# Patient Record
Sex: Male | Born: 1985 | Race: White | Hispanic: No | Marital: Single | Smoking: Never smoker
Health system: Southern US, Community
[De-identification: ages and names within clinical notes are randomized; demographics above are authoritative.]

## PROBLEM LIST (undated history)

## (undated) DIAGNOSIS — U071 COVID-19: Secondary | ICD-10-CM

## (undated) DIAGNOSIS — F419 Anxiety disorder, unspecified: Secondary | ICD-10-CM

## (undated) DIAGNOSIS — G43909 Migraine, unspecified, not intractable, without status migrainosus: Secondary | ICD-10-CM

## (undated) DIAGNOSIS — R61 Generalized hyperhidrosis: Secondary | ICD-10-CM

## (undated) DIAGNOSIS — K219 Gastro-esophageal reflux disease without esophagitis: Secondary | ICD-10-CM

## (undated) HISTORY — DX: Anxiety disorder, unspecified: F41.9

## (undated) HISTORY — DX: COVID-19: U07.1

## (undated) HISTORY — DX: Gastro-esophageal reflux disease without esophagitis: K21.9

## (undated) HISTORY — DX: Generalized hyperhidrosis: R61

## (undated) HISTORY — PX: TONSILLECTOMY: SUR1361

## (undated) HISTORY — DX: Migraine, unspecified, not intractable, without status migrainosus: G43.909

---

## 2004-05-05 ENCOUNTER — Ambulatory Visit: Payer: Self-pay | Admitting: Pediatrics

## 2004-05-13 ENCOUNTER — Ambulatory Visit: Payer: Self-pay | Admitting: Pediatrics

## 2004-05-27 ENCOUNTER — Ambulatory Visit: Payer: Self-pay | Admitting: Gastroenterology

## 2004-09-21 ENCOUNTER — Ambulatory Visit: Payer: Self-pay | Admitting: Gastroenterology

## 2004-12-20 ENCOUNTER — Ambulatory Visit: Payer: Self-pay | Admitting: Gastroenterology

## 2006-01-14 IMAGING — CT CT ABD-PELV W/ CM
1 of 2 series · 15 of 32 positions shown, 19 images · non-contrast
Comparison: none

REASON FOR EXAM: Abdominal pain, RIGHT lower quadrant pain
COMMENTS:

[Series 2: appendicitis · axial · 0.64mm/px · z∈[-537,-165]mm · 15 of 136 slices shown, 19 images]
[im 6/136  soft-tissue]
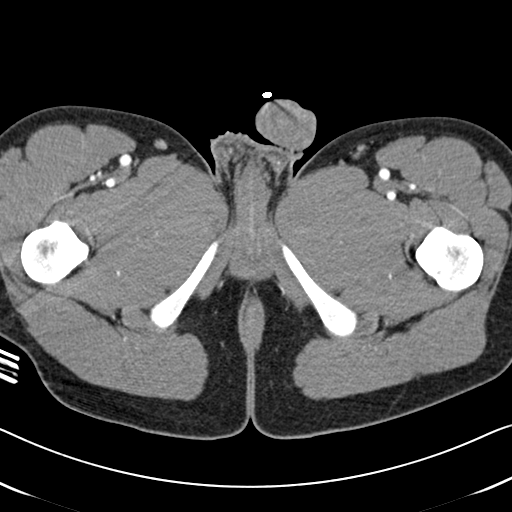
[im 6/136  bone]
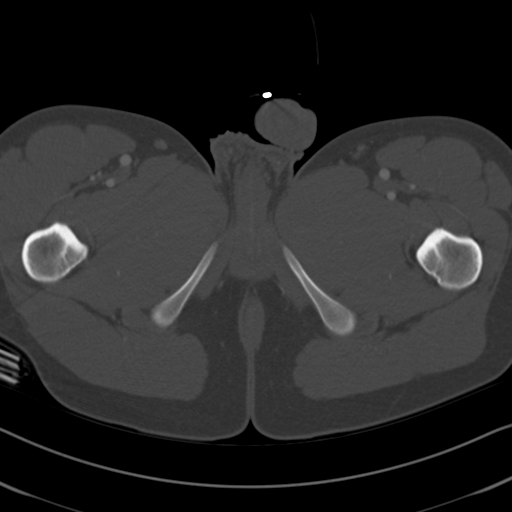
[im 16/136  soft-tissue]
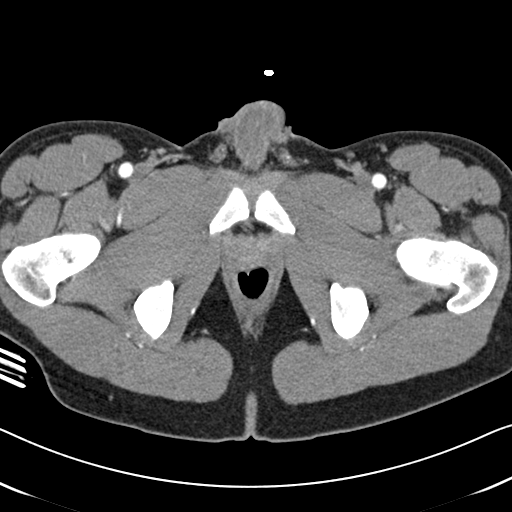
[im 26/136  soft-tissue]
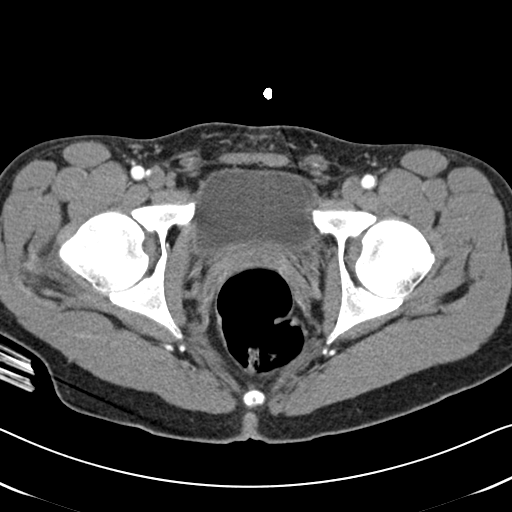
[im 37/136  soft-tissue]
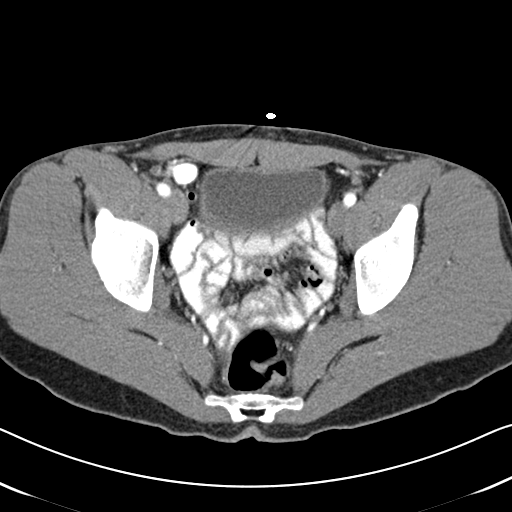
[im 47/136  soft-tissue]
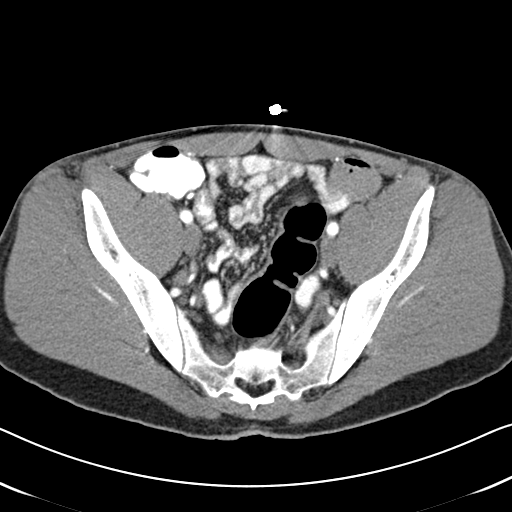
[im 58/136  soft-tissue]
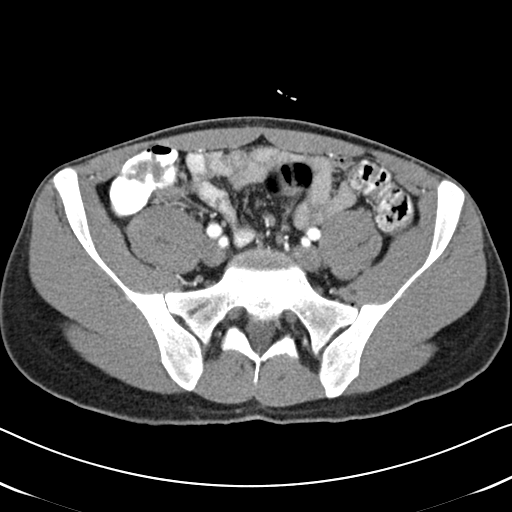
[im 68/136  soft-tissue]
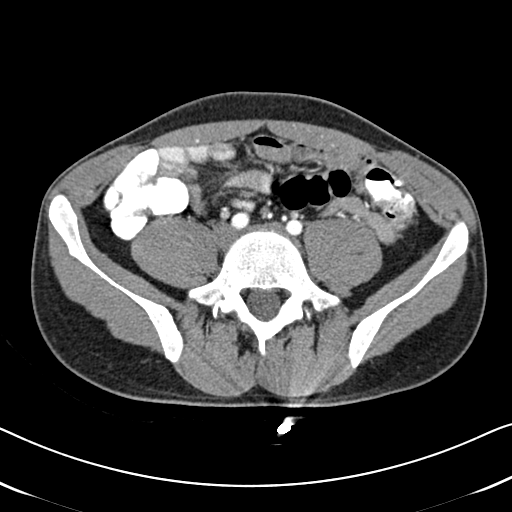
[im 78/136  soft-tissue]
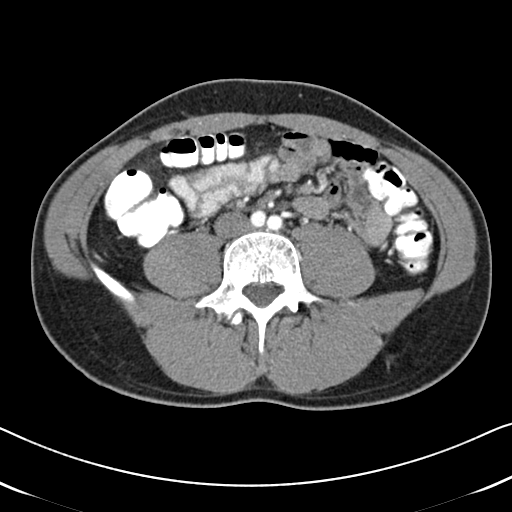
[im 89/136  soft-tissue]
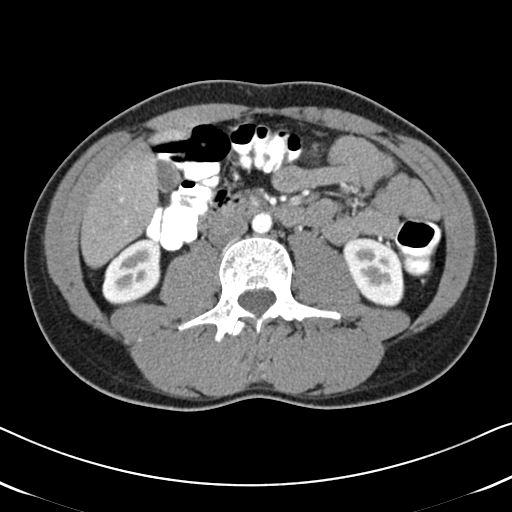
[im 89/136  bone]
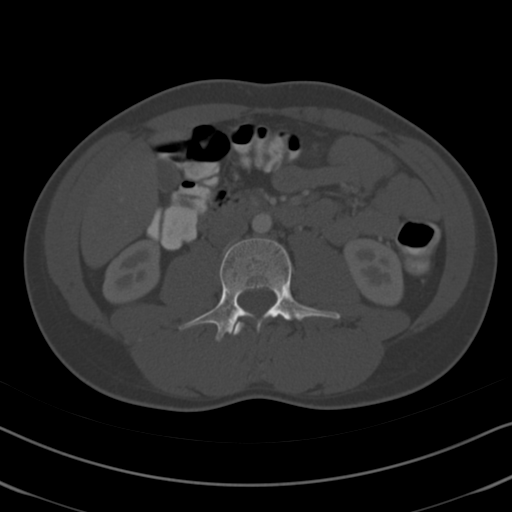
[im 99/136  soft-tissue]
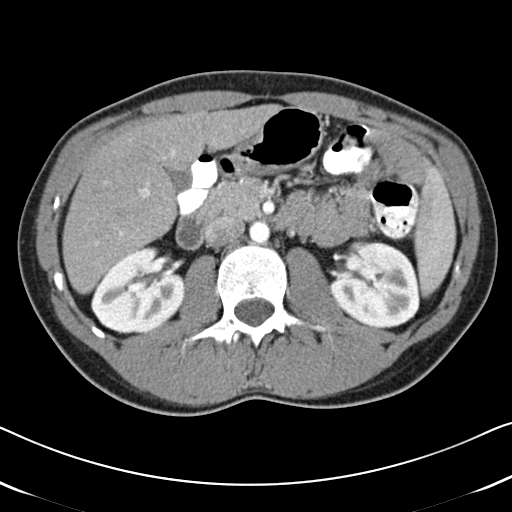
[im 110/136  soft-tissue]
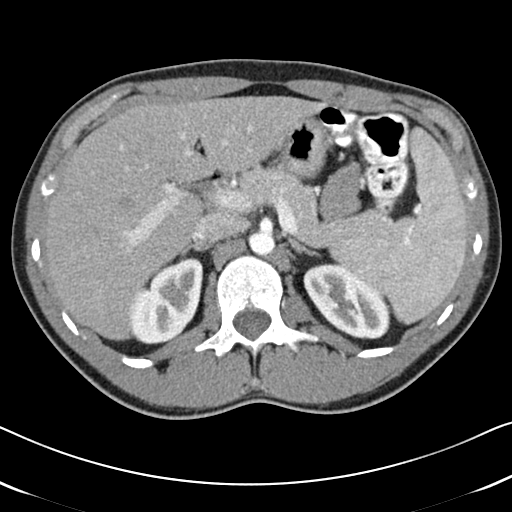
[im 115/136  lung]
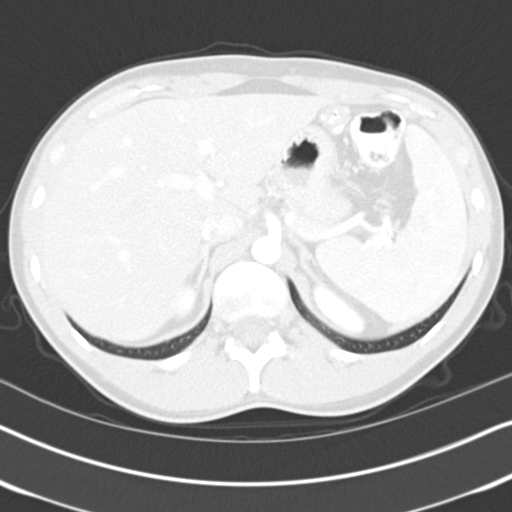
[im 120/136  soft-tissue]
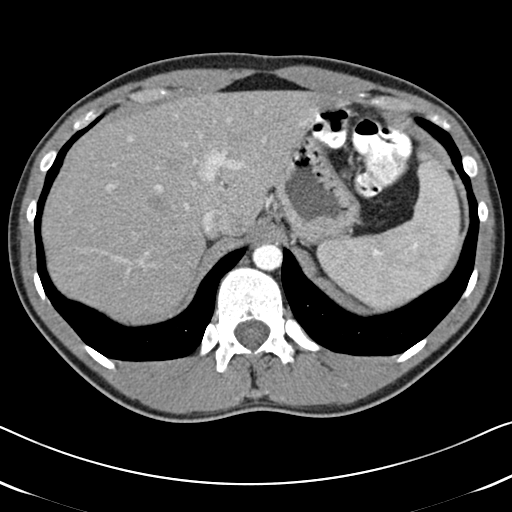
[im 120/136  lung]
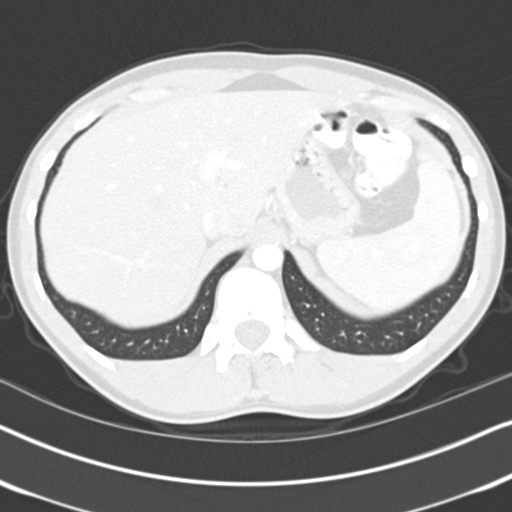
[im 125/136  lung]
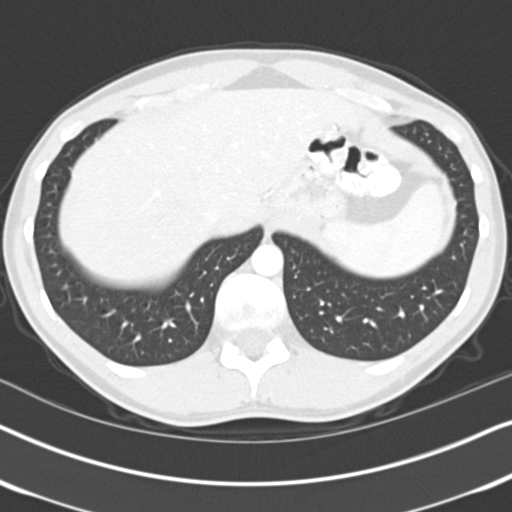
[im 130/136  soft-tissue]
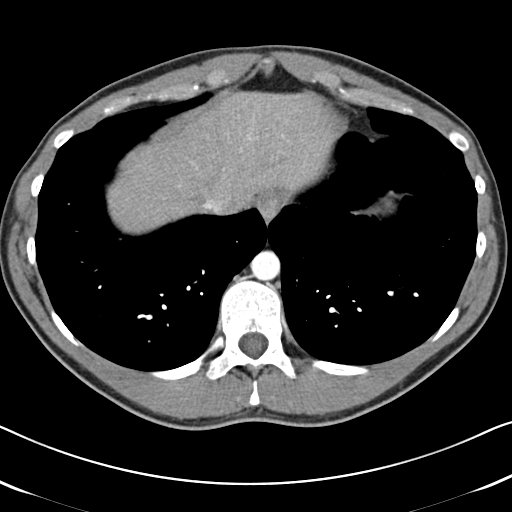
[im 130/136  lung]
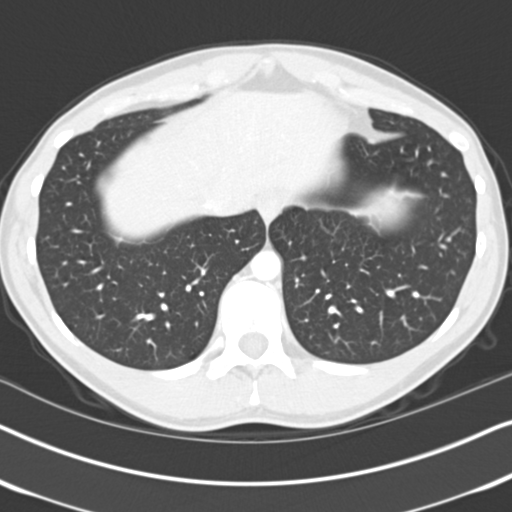

[15 of 32 positions shown; findings below may reference images not displayed]

PROCEDURE:     CT  - CT ABDOMEN / PELVIS  W  - May 05, 2004  [DATE]

RESULT:     3-mm helical cuts were performed through the abdomen and pelvis
with oral and 100 cc of Isovue-8AN contrast.

Lung bases are clear.  No pleural or pericardial effusions are present.  The
solid organs of the abdomen are unremarkable.  No free intraperitoneal
fluid, air, or adenopathy is noted.

In the RIGHT lower quadrant, the appendix fills normally with contrast.
There is no evidence of periappendiceal inflammatory changes or suspicious
fluid collection.  There is some subtle thickening of the terminal ileum but
I suspect this is simply incomplete distention with contrast.  The bladder
distends normally without evidence of filling defect or wall thickening.
There is no inguinal mass or adenopathy.
IMPRESSION: No CT evidence of appendicitis.  The appendix itself is visualized and fills
normally with oral contrast.  There is no periappendiceal inflammatory
changes or thickening.

No suspicious solid organ abnormalities, free intraperitoneal fluid, air, or
adenopathy.

Lung bases are clear.

## 2006-06-02 IMAGING — US ABDOMEN ULTRASOUND
1 series · 17 of 25 positions shown · non-contrast
Comparison: none

REASON FOR EXAM: RUQ pain, dyspepsia
                          If neg U/S, do HIDA with CCK
COMMENTS:

[Series 1: abdomen ultrasound · 17 of 40 slices shown]
[im 1/40]
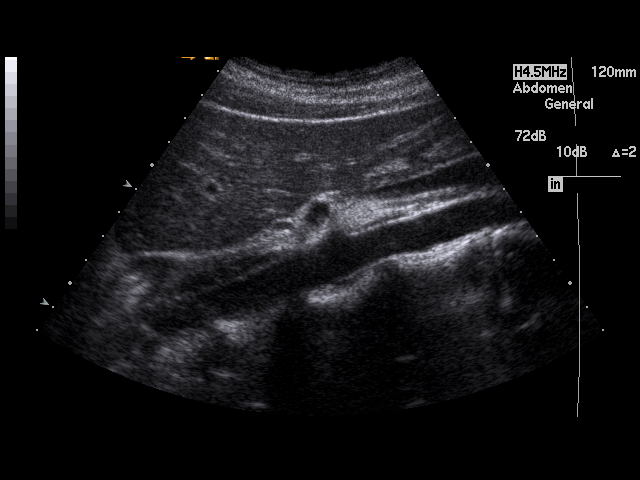
[im 4/40]
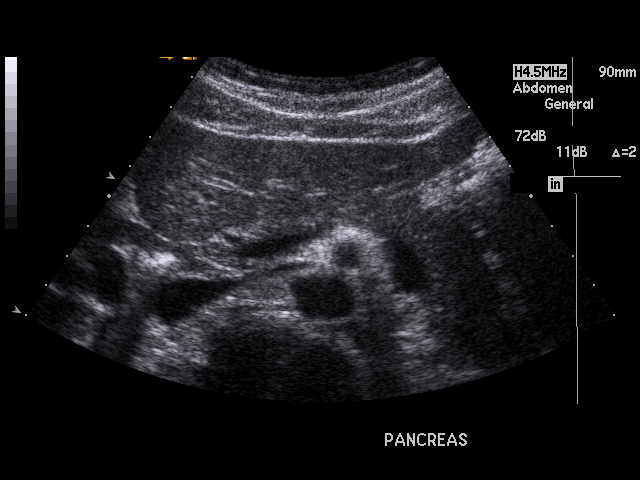
[im 5/40]
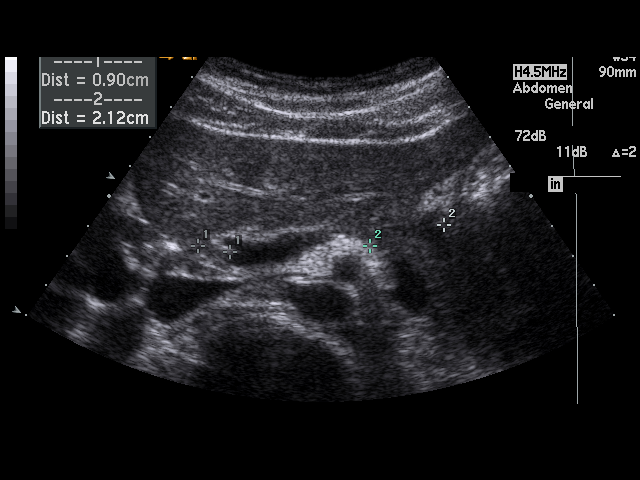
[im 9/40]
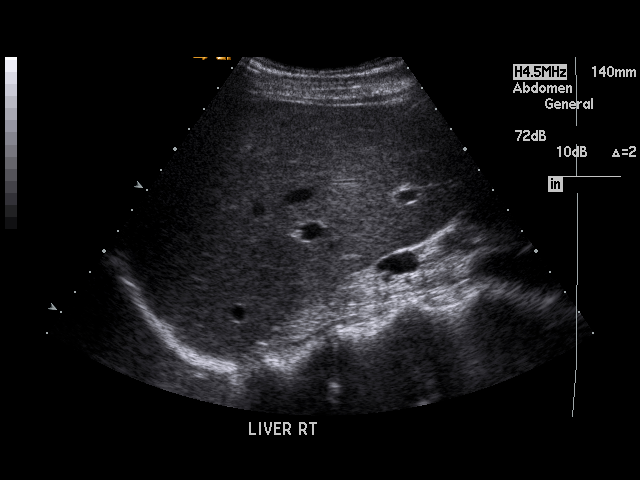
[im 10/40]
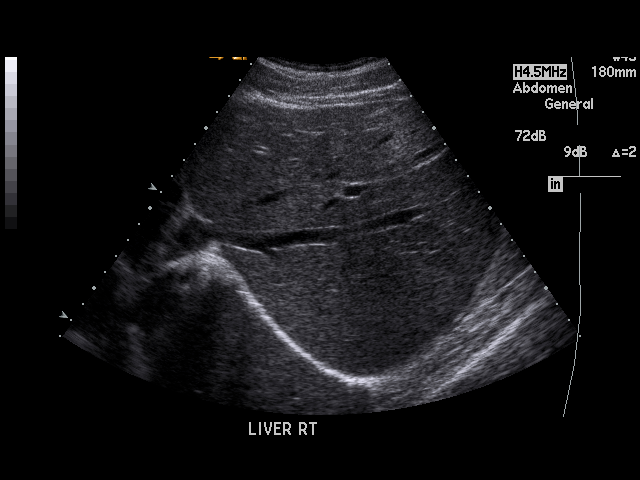
[im 14/40]
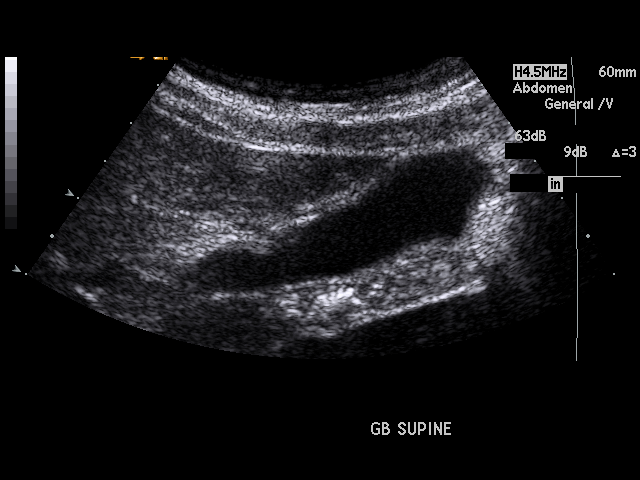
[im 15/40]
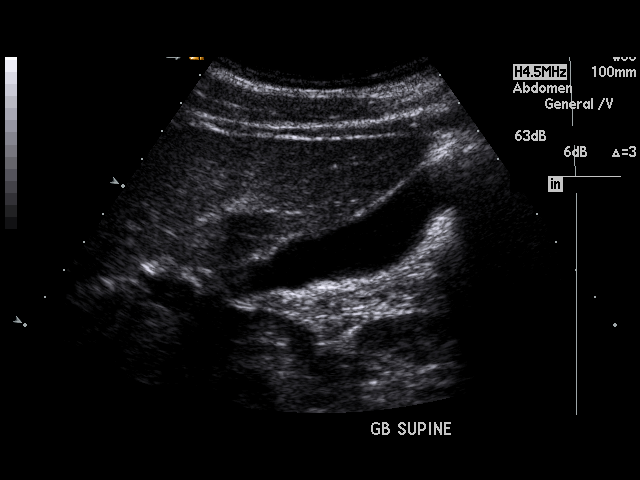
[im 18/40]
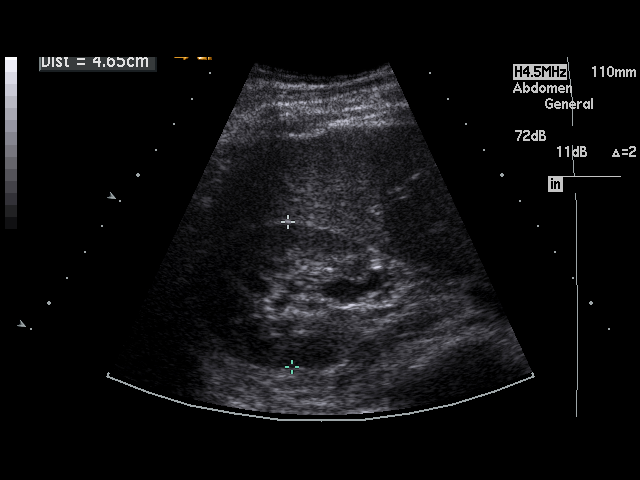
[im 20/40]
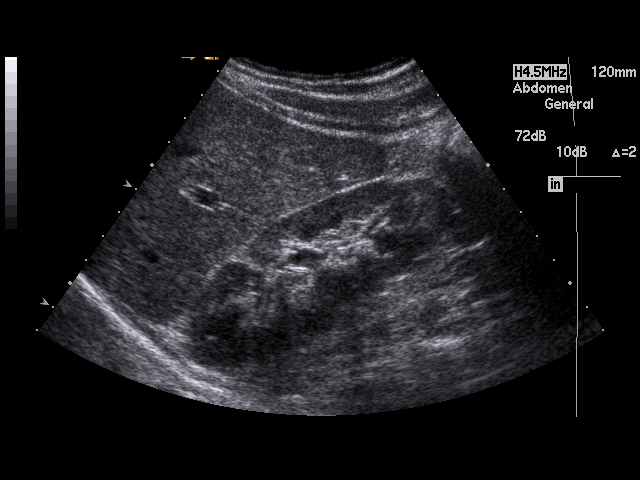
[im 22/40]
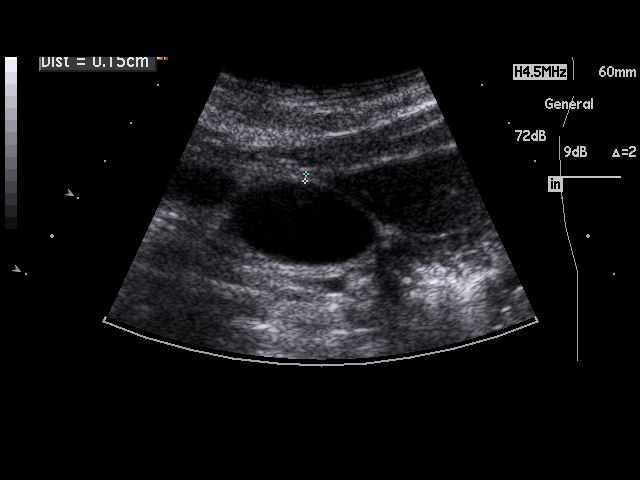
[im 25/40]
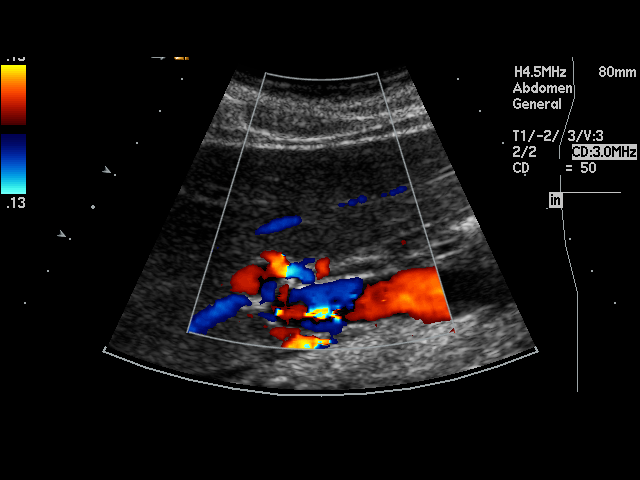
[im 27/40]
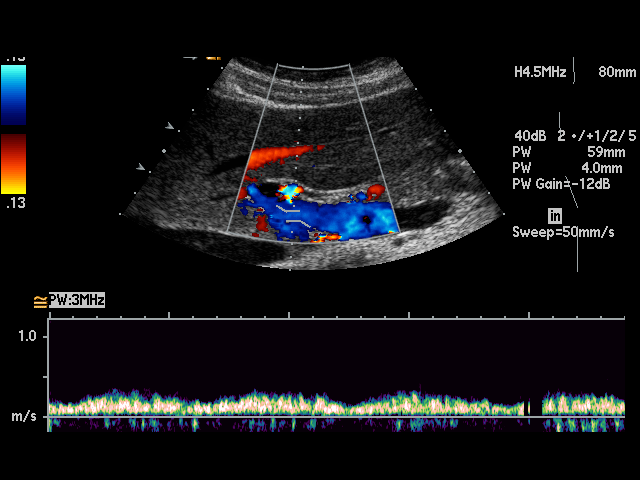
[im 30/40]
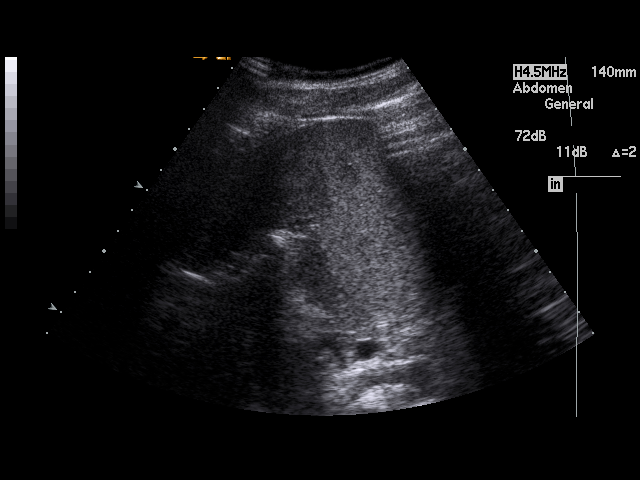
[im 31/40]
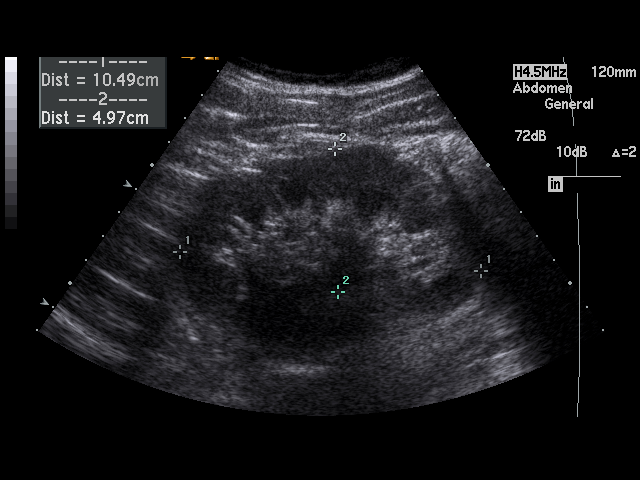
[im 35/40]
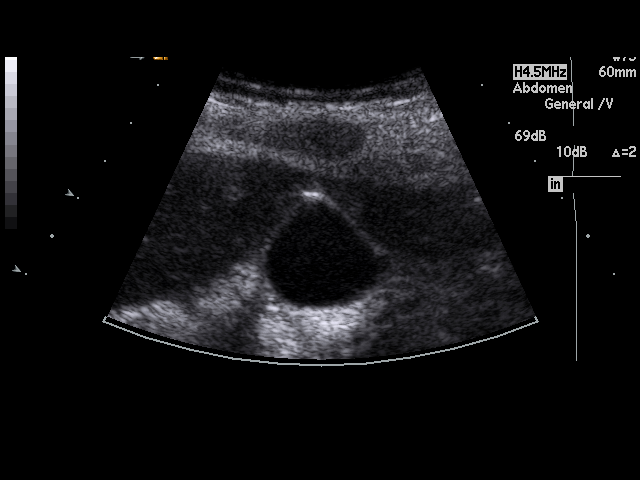
[im 36/40]
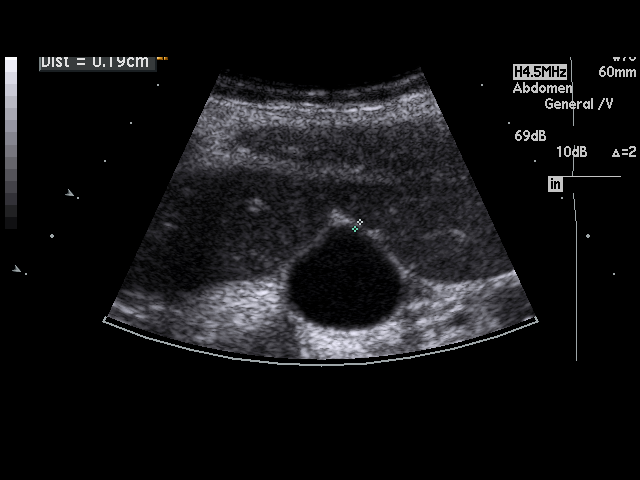
[im 40/40]
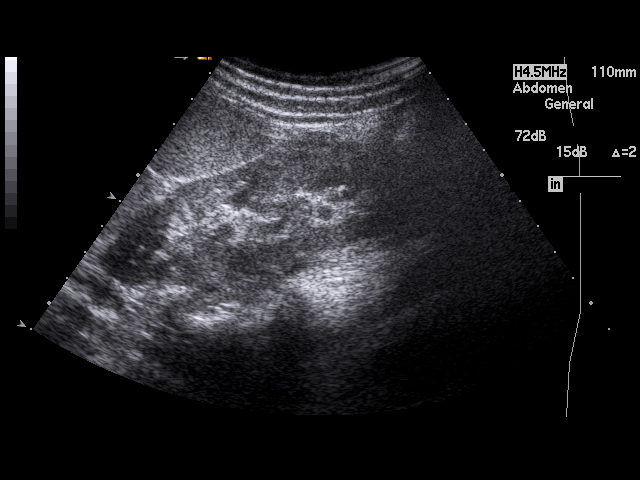

[17 of 25 positions shown; findings below may reference images not displayed]

PROCEDURE:     US  - US ABDOMEN GENERAL SURVEY  - September 21, 2004  [DATE]

RESULT:     The liver is normal. The abdominal aorta is normal. The spleen
is unremarkable. The pancreas is normal. No gallstones are noted. There is
no evidence of hydronephrosis. The RIGHT kidney measures 10.6 cm. The LEFT
kidney measures 10.5 cm.
IMPRESSION: Negative exam.

## 2006-06-02 IMAGING — NM NUCLEAR MEDICINE HEPATOHBILIARY INCLUDE GB
1 series · 6 of 6 positions shown · non-contrast
Comparison: none

REASON FOR EXAM: RUQ pain and dyspepsia.  If neg US do HIDA with CCK
COMMENTS:

[Series 0: hepato · 3.3mm · 3.31mm/px · 6 of 7 frames shown]
[frame 1/7]
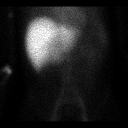
[frame 2/7]
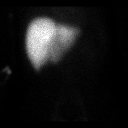
[frame 3/7]
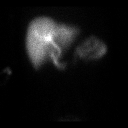
[frame 4/7]
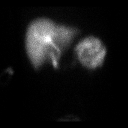
[frame 5/7]
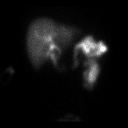
[frame 7/7]
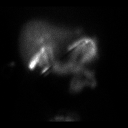

[6 of 6 positions shown; findings below may reference images not displayed]

PROCEDURE:     NM  - NM HEPATO WITH GB EJECT FRACTION  - September 21, 2004 [DATE]

RESULT:     Following administration of 7.79 millicuries of technetium-88m
Choletec, hepatobiliary scan was obtained.  The liver is normal.
Gallbladder fills at 60 minutes.  There is no evidence of biliary
obstruction.  Radiopharmaceutical is noted in the small bowel.  The
gallbladder ejection fraction at 30 minutes is 79.9%.
IMPRESSION: Normal exam.

## 2006-06-02 IMAGING — NM NUCLEAR MEDICINE HEPATOHBILIARY INCLUDE GB
1 series · 6 of 6 positions shown · non-contrast
Comparison: none

REASON FOR EXAM: RUQ pain and dyspepsia.  If neg US do HIDA with CCK
COMMENTS:

[Series 0: cck hepato · 3.9mm · 3.90mm/px · 6 of 60 frames shown]
[frame 6/60]
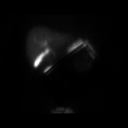
[frame 16/60]
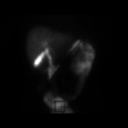
[frame 26/60]
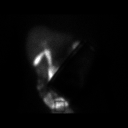
[frame 36/60]
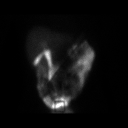
[frame 46/60]
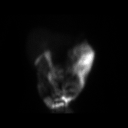
[frame 56/60]
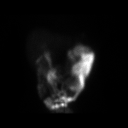

[6 of 6 positions shown; findings below may reference images not displayed]

PROCEDURE:     NM  - NM HEPATO WITH GB EJECT FRACTION  - September 21, 2004 [DATE]

RESULT:     Following administration of 7.79 millicuries of technetium-88m
Choletec, hepatobiliary scan was obtained.  The liver is normal.
Gallbladder fills at 60 minutes.  There is no evidence of biliary
obstruction.  Radiopharmaceutical is noted in the small bowel.  The
gallbladder ejection fraction at 30 minutes is 79.9%.
IMPRESSION: Normal exam.

## 2017-03-27 ENCOUNTER — Ambulatory Visit (INDEPENDENT_AMBULATORY_CARE_PROVIDER_SITE_OTHER): Payer: Self-pay | Admitting: Internal Medicine

## 2017-03-27 VITALS — BP 126/88 | HR 90 | Temp 99.0°F | Ht 71.0 in | Wt 212.4 lb

## 2017-03-27 DIAGNOSIS — Z7721 Contact with and (suspected) exposure to potentially hazardous body fluids: Secondary | ICD-10-CM

## 2017-03-27 DIAGNOSIS — K219 Gastro-esophageal reflux disease without esophagitis: Secondary | ICD-10-CM

## 2017-03-27 DIAGNOSIS — F419 Anxiety disorder, unspecified: Secondary | ICD-10-CM

## 2017-03-27 DIAGNOSIS — G43909 Migraine, unspecified, not intractable, without status migrainosus: Secondary | ICD-10-CM

## 2017-03-27 DIAGNOSIS — G8929 Other chronic pain: Secondary | ICD-10-CM

## 2017-03-27 DIAGNOSIS — M25511 Pain in right shoulder: Secondary | ICD-10-CM

## 2017-03-27 MED ORDER — SUMATRIPTAN SUCCINATE 50 MG PO TABS: ORAL_TABLET | ORAL | 2 refills | Status: DC

## 2017-03-27 MED ORDER — ESOMEPRAZOLE MAGNESIUM 40 MG PO CPDR: 40.0000 mg | DELAYED_RELEASE_CAPSULE | ORAL | 1 refills | Status: DC

## 2017-03-27 MED ORDER — PROMETHAZINE HCL 25 MG PO TABS: 25.0000 mg | ORAL_TABLET | Freq: Two times a day (BID) | ORAL | 2 refills | Status: DC | PRN

## 2017-03-27 MED ORDER — PROPRANOLOL HCL ER 120 MG PO CP24: 120.0000 mg | ORAL_CAPSULE | Freq: Every day | ORAL | 1 refills | Status: DC

## 2017-03-27 NOTE — Patient Instructions (Addendum)
[facility] about [facility]  Please continue to take multivitamin with iron  Take care  F/u in 3-6 months sooner if needed   Restless Legs Syndrome Restless legs syndrome is a condition that causes uncomfortable feelings or sensations in the legs, especially while sitting or lying down. The sensations usually cause an overwhelming urge to move the legs. The arms can also sometimes be affected. The condition can range from mild to severe. The symptoms often interfere with a person's ability to sleep. What are the causes? The cause of this condition is not known. What increases the risk? This condition is more likely to develop in:  People who are older than age 34.  Pregnant women. In general, restless legs syndrome is more common in women than in men.  People who have a family history of the condition.  People who have certain medical conditions, such as iron deficiency, kidney disease, Parkinson disease, or nerve damage.  People who take certain medicines, such as medicines for high blood pressure, nausea, colds, allergies, depression, and some heart conditions.  What are the signs or symptoms? The main symptom of this condition is uncomfortable sensations in the legs. These sensations may be:  Described as pulling, tingling, prickling, throbbing, crawling, or burning.  Worse while you are sitting or lying down.  Worse during periods of rest or inactivity.  Worse at night, often interfering with your sleep.  Accompanied by a very strong urge to move your legs.  Temporarily relieved by movement of your legs.  The sensations usually affect both sides of the body. The arms can also be affected, but this is rare. People who have this condition often have tiredness during the day because of their lack of sleep at night. How is this diagnosed? This condition may be diagnosed based on your description of the symptoms. You may also have tests, including blood tests, to check for  other conditions that may lead to your symptoms. In some cases, you may be asked to spend some time in a sleep lab so your sleeping can be monitored. How is this treated? Treatment for this condition is focused on managing the symptoms. Treatment may include:  Self-help and lifestyle changes.  Medicines.  Follow these instructions at home:  Take medicines only as directed by your health care provider.  Try these methods to get temporary relief from the uncomfortable sensations: ? Massage your legs. ? Walk or stretch. ? Take a cold or hot bath.  Practice good sleep habits. For example, go to bed and get up at the same time every day.  Exercise regularly.  Practice ways of relaxing, such as yoga or meditation.  Avoid caffeine and alcohol.  Do not use any tobacco products, including cigarettes, chewing tobacco, or electronic cigarettes. If you need help quitting, ask your health care provider.  Keep all follow-up visits as directed by your health care provider. This is important. Contact a health care provider if: Your symptoms do not improve with treatment, or they get worse. This information is not intended to replace advice given to you by your health care provider. Make sure you discuss any questions you have with your health care provider. Document Released: 12/24/2001 Document Revised: 06/11/2015 Document Reviewed: 12/30/2013 Elsevier Interactive Patient Education  [employer]2018 [employer] Inc[employer]

## 2017-03-27 NOTE — Progress Notes (Signed)
Pre visit review using our clinic review tool, if applicable. No additional management support is needed unless otherwise documented below in the visit note. 

## 2017-03-28 ENCOUNTER — Encounter: Payer: Self-pay | Admitting: Internal Medicine
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
## 2017-03-29 DIAGNOSIS — G43909 Migraine, unspecified, not intractable, without status migrainosus: Secondary | ICD-10-CM | POA: Insufficient documentation

## 2017-03-29 DIAGNOSIS — F419 Anxiety disorder, unspecified: Secondary | ICD-10-CM | POA: Insufficient documentation

## 2017-03-29 DIAGNOSIS — K219 Gastro-esophageal reflux disease without esophagitis: Secondary | ICD-10-CM | POA: Insufficient documentation

## 2017-09-28 ENCOUNTER — Ambulatory Visit: Payer: Self-pay | Admitting: Internal Medicine

## 2017-11-15 ENCOUNTER — Ambulatory Visit: Payer: Self-pay | Admitting: Internal Medicine

## 2017-11-23 ENCOUNTER — Other Ambulatory Visit: Payer: Self-pay | Admitting: Internal Medicine

## 2017-12-08 ENCOUNTER — Ambulatory Visit (INDEPENDENT_AMBULATORY_CARE_PROVIDER_SITE_OTHER): Payer: Self-pay | Admitting: Internal Medicine

## 2017-12-08 ENCOUNTER — Encounter: Payer: Self-pay | Admitting: Internal Medicine

## 2017-12-08 VITALS — BP 128/80 | HR 94 | Temp 98.7°F | Ht 71.0 in | Wt 219.2 lb

## 2017-12-08 DIAGNOSIS — G43909 Migraine, unspecified, not intractable, without status migrainosus: Secondary | ICD-10-CM

## 2017-12-08 DIAGNOSIS — K219 Gastro-esophageal reflux disease without esophagitis: Secondary | ICD-10-CM

## 2017-12-08 DIAGNOSIS — F419 Anxiety disorder, unspecified: Secondary | ICD-10-CM

## 2017-12-08 MED ORDER — SUMATRIPTAN SUCCINATE 50 MG PO TABS: ORAL_TABLET | ORAL | 11 refills | Status: DC

## 2017-12-08 MED ORDER — PROMETHAZINE HCL 25 MG PO TABS: 25.0000 mg | ORAL_TABLET | Freq: Two times a day (BID) | ORAL | 11 refills | Status: DC | PRN

## 2017-12-08 MED ORDER — PROPRANOLOL HCL ER 120 MG PO CP24: 120.0000 mg | ORAL_CAPSULE | Freq: Every day | ORAL | 3 refills | Status: DC

## 2017-12-08 MED ORDER — ESOMEPRAZOLE MAGNESIUM 40 MG PO CPDR: 40.0000 mg | DELAYED_RELEASE_CAPSULE | ORAL | 3 refills | Status: DC

## 2017-12-08 NOTE — Progress Notes (Signed)
Chief Complaint  Patient presents with  . Follow-up   F/u  1. Migraines controlled getting 1x per month and medications help  2. Leg sx's moving legs a lot improved with compression during the day 3. Anxiety work related does not need meds or therapy for now    Review of Systems  Constitutional: Negative for weight loss.  HENT: Negative for hearing loss.   Eyes: Negative for blurred vision.  Respiratory: Negative for shortness of breath.   Cardiovascular: Negative for chest pain.  Gastrointestinal: Negative for abdominal pain.  Skin: Negative for rash.  Neurological: Negative for headaches.  Psychiatric/Behavioral: Negative for depression. The patient is nervous/anxious.    Past Medical History:  Diagnosis Date  . Anxiety   . GERD (gastroesophageal reflux disease)   . Migraine    Past Surgical History:  Procedure Laterality Date  . TONSILLECTOMY     1990   Family History  Problem Relation Age of Onset  . Iron deficiency Father    Social History   Socioeconomic History  . Marital status: Single    Spouse name: Not on file  . Number of children: Not on file  . Years of education: Not on file  . Highest education level: Not on file  Occupational History  . Not on file  Social Needs  . Financial resource strain: Not on file  . Food insecurity:    Worry: Not on file    Inability: Not on file  . Transportation needs:    Medical: Not on file    Non-medical: Not on file  Tobacco Use  . Smoking status: Never Smoker  . Smokeless tobacco: Never Used  Substance and Sexual Activity  . Alcohol use: Yes    Comment: occas  . Drug use: No  . Sexual activity: Never  Lifestyle  . Physical activity:    Days per week: Not on file    Minutes per session: Not on file  . Stress: Not on file  Relationships  . Social connections:    Talks on phone: Not on file    Gets together: Not on file    Attends religious service: Not on file    Active member of club or  organization: Not on file    Attends meetings of clubs or organizations: Not on file    Relationship status: Not on file  . Intimate partner violence:    Fear of current or ex partner: Not on file    Emotionally abused: Not on file    Physically abused: Not on file    Forced sexual activity: Not on file  Other Topics Concern  . 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 esomeprazole (NEXIUM) 40 MG capsule Take 1 capsule (40 mg total) by mouth every morning.  . promethazine (PHENERGAN) 25 MG tablet Take 1 tablet (25 mg total) by mouth 2 (two) times daily as needed for nausea or vomiting (migraine).  . propranolol ER (INDERAL LA) 120 MG 24 hr capsule Take 1 capsule (120 mg total) by mouth daily.  . SUMAtriptan (IMITREX) 50 MG tablet SEE NOTES   Allergies  Allergen Reactions  . Sulfa Antibiotics Anaphylaxis and Shortness Of Breath   No results found for this or any previous visit (from the past 2160 hour(s)). Objective  Body mass index is 30.57 kg/m. Wt Readings from Last 3 Encounters:  12/08/17 219 lb 3.2 oz (99.4 kg)  03/27/17 212 lb 6.4 oz (96.3 kg)   Temp Readings from Last 3 Encounters:  12/08/17 98.7 F (37.1 C) (Oral)  03/27/17 99 F (37.2 C) (Oral)   BP Readings from Last 3 Encounters:  12/08/17 128/80  03/27/17 126/88   Pulse Readings from Last 3 Encounters:  12/08/17 94  03/27/17 90    Physical Exam  Constitutional: He is oriented to person, place, and time. Vital signs are normal. He appears well-developed and well-nourished. He is cooperative.  HENT:  Head: Normocephalic and atraumatic.  Mouth/Throat: Oropharynx is clear and moist and mucous membranes are normal.  Eyes: Pupils are equal, round, and reactive to light. Conjunctivae are normal.  Cardiovascular: Normal rate, regular rhythm and normal heart sounds.   Pulmonary/Chest: Effort normal and breath sounds normal.  Neurological: He is alert and oriented to person, place, and time. Gait normal.  Skin: Skin is warm, dry and intact.  Psychiatric: He has a normal mood and affect. His speech is normal and behavior is normal. Judgment and thought content normal. Cognition and memory are normal.  Nursing note and vitals reviewed.   Assessment   1. Migraines  2. Anxiety  3. HM Plan   1. Refilled meds  disc magnesium 400-500 mg qd and B2 200 mg 2x per day  2. Disc accupuncture lotus or [facility] in [place] 3.  No flu shot  Tdap had 04/21/16 pharmacy  Hep B immune 12/2015 titer >1000  Blood exposure wants HCV, HIV not sexually active  Will check CMET, CBC, TSH, UA, vitamin D lab s He has had colonoscopy ~2010  Provider: Dr. French Ana [facility]

## 2017-12-08 NOTE — Progress Notes (Signed)
Pre visit review using our clinic review tool, if applicable. No additional management support is needed unless otherwise documented below in the visit note. 

## 2017-12-08 NOTE — Patient Instructions (Addendum)
Lotus accupunture-name [employer] and [place] accupuncture  Massage or facial    Magnesium 400-500 mg daily helps with migraine prevention  Vitamin B2 200 mg 2x per day   Healthier food choices and snacks   Mediterranean Diet A Mediterranean diet refers to food and lifestyle choices that are based on the traditions of countries located on the [employer]. This way of eating has been shown to help prevent certain conditions and improve outcomes for people who have chronic diseases, like kidney disease and heart disease. What are tips for following this plan? Lifestyle  Cook and eat meals together with your family, when possible.  Drink enough fluid to keep your urine clear or pale yellow.  Be physically active every day. This includes: ? Aerobic exercise like running or swimming. ? Leisure activities like gardening, walking, or housework.  Get 7-8 hours of sleep each night.  If recommended by your health care provider, drink red wine in moderation. This means 1 glass a day for nonpregnant women and 2 glasses a day for men. A glass of wine equals 5 oz (150 mL). Reading food labels  Check the serving size of packaged foods. For foods such as rice and pasta, the serving size refers to the amount of cooked product, not dry.  Check the total fat in packaged foods. Avoid foods that have saturated fat or trans fats.  Check the ingredients list for added sugars, such as corn syrup. Shopping  At the grocery store, buy most of your food from the areas near the walls of the store. This includes: ? Fresh fruits and vegetables (produce). ? Grains, beans, nuts, and seeds. Some of these may be available in unpackaged forms or large amounts (in bulk). ? Fresh seafood. ? Poultry and eggs. ? Low-fat dairy products.  Buy whole ingredients instead of prepackaged foods.  Buy fresh fruits and vegetables in-season from local farmers markets.  Buy frozen fruits and  vegetables in resealable bags.  If you do not have access to quality fresh seafood, buy precooked frozen shrimp or canned fish, such as tuna, salmon, or sardines.  Buy small amounts of raw or cooked vegetables, salads, or olives from the deli or salad bar at your store.  Stock your pantry so you always have certain foods on hand, such as olive oil, canned tuna, canned tomatoes, rice, pasta, and beans. Cooking  Cook foods with extra-virgin olive oil instead of using butter or other vegetable oils.  Have meat as a side dish, and have vegetables or grains as your main dish. This means having meat in small portions or adding small amounts of meat to foods like pasta or stew.  Use beans or vegetables instead of meat in common dishes like chili or lasagna.  Experiment with different cooking methods. Try roasting or broiling vegetables instead of steaming or sauteing them.  Add frozen vegetables to soups, stews, pasta, or rice.  Add nuts or seeds for added healthy fat at each meal. You can add these to yogurt, salads, or vegetable dishes.  Marinate fish or vegetables using olive oil, lemon juice, garlic, and fresh herbs. Meal planning  Plan to eat 1 vegetarian meal one day each week. Try to work up to 2 vegetarian meals, if possible.  Eat seafood 2 or more times a week.  Have healthy snacks readily available, such as: ? Vegetable sticks with hummus. ? [place] yogurt. ? Fruit and nut trail mix.  Eat balanced meals throughout the week. This  includes: ? Fruit: 2-3 servings a day ? Vegetables: 4-5 servings a day ? Low-fat dairy: 2 servings a day ? Fish, poultry, or lean meat: 1 serving a day ? Beans and legumes: 2 or more servings a week ? Nuts and seeds: 1-2 servings a day ? Whole grains: 6-8 servings a day ? Extra-virgin olive oil: 3-4 servings a day  Limit red meat and sweets to only a few servings a month What are my food choices?  Mediterranean diet ? Recommended ? Grains:  Whole-grain pasta. Brown rice. Bulgar wheat. Polenta. Couscous. Whole-wheat bread. Orpah Cobb. ? Vegetables: Artichokes. Beets. Broccoli. Cabbage. Carrots. Eggplant. Green beans. Chard. Kale. Spinach. Onions. Leeks. Peas. Squash. Tomatoes. Peppers. Radishes. ? Fruits: Apples. Apricots. Avocado. Berries. Bananas. Cherries. Dates. Figs. Grapes. Lemons. Melon. Oranges. Peaches. Plums. Pomegranate. ? Meats and other protein foods: Beans. Almonds. Sunflower seeds. Pine nuts. Peanuts. Cod. Salmon. Scallops. Shrimp. Tuna. Tilapia. Clams. Oysters. Eggs. ? Dairy: Low-fat milk. Cheese. Greek yogurt. ? Beverages: Water. Red wine. Herbal tea. ? Fats and oils: Extra virgin olive oil. Avocado oil. Grape seed oil. ? Sweets and desserts: [place] yogurt with honey. Baked apples. Poached pears. Trail mix. ? Seasoning and other foods: Basil. Cilantro. Coriander. Cumin. Mint. Parsley. Sage. Rosemary. Tarragon. Garlic. Oregano. Thyme. Pepper. Balsalmic vinegar. Tahini. Hummus. Tomato sauce. Olives. Mushrooms. ? Limit these ? Grains: Prepackaged pasta or rice dishes. Prepackaged cereal with added sugar. ? Vegetables: Deep fried potatoes (french fries). ? Fruits: Fruit canned in syrup. ? Meats and other protein foods: Beef. Pork. Lamb. Poultry with skin. Hot dogs. Tomasa Blase. ? Dairy: Ice cream. Sour cream. Whole milk. ? Beverages: Juice. Sugar-sweetened soft drinks. Beer. Liquor and spirits. ? Fats and oils: Butter. Canola oil. Vegetable oil. Beef fat (tallow). Lard. ? Sweets and desserts: Cookies. Cakes. Pies. Candy. ? Seasoning and other foods: Mayonnaise. Premade sauces and marinades. ? The items listed may not be a complete list. Talk with your dietitian about what dietary choices are right for you. Summary  The Mediterranean diet includes both food and lifestyle choices.  Eat a variety of fresh fruits and vegetables, beans, nuts, seeds, and whole grains.  Limit the amount of red meat and sweets that you  eat.  Talk with your health care provider about whether it is safe for you to drink red wine in moderation. This means 1 glass a day for nonpregnant women and 2 glasses a day for men. A glass of wine equals 5 oz (150 mL). This information is not intended to replace advice given to you by your health care provider. Make sure you discuss any questions you have with your health care provider. Document Released: 08/27/2015 Document Revised: 09/29/2015 Document Reviewed: 08/27/2015 Elsevier Interactive Patient Education  2018 [employer].    Migraine Headache A migraine headache is an intense, throbbing pain on one side or both sides of the head. Migraines may also cause other symptoms, such as nausea, vomiting, and sensitivity to light and noise. What are the causes? Doing or taking certain things may also trigger migraines, such as:  Alcohol.  Smoking.  Medicines, such as: ? Medicine used to treat chest pain (nitroglycerine). ? Birth control pills. ? Estrogen pills. ? Certain blood pressure medicines.  Aged cheeses, chocolate, or caffeine.  Foods or drinks that contain nitrates, glutamate, aspartame, or tyramine.  Physical activity.  Other things that may trigger a migraine include:  Menstruation.  Pregnancy.  Hunger.  Stress, lack of sleep, too much sleep, or fatigue.  Weather changes.  What increases the risk? The following factors may make you more likely to experience migraine headaches:  Age. Risk increases with age.  Family history of migraine headaches.  Being Caucasian.  Depression and anxiety.  Obesity.  Being a woman.  Having a hole in the heart (patent foramen ovale) or other heart problems.  What are the signs or symptoms? The main symptom of this condition is pulsating or throbbing pain. Pain may:  Happen in any area of the head, such as on one side or both sides.  Interfere with daily activities.  Get worse with physical activity.  Get  worse with exposure to bright lights or loud noises.  Other symptoms may include:  Nausea.  Vomiting.  Dizziness.  General sensitivity to bright lights, loud noises, or smells.  Before you get a migraine, you may get warning signs that a migraine is developing (aura). An aura may include:  Seeing flashing lights or having blind spots.  Seeing bright spots, halos, or zigzag lines.  Having tunnel vision or blurred vision.  Having numbness or a tingling feeling.  Having trouble talking.  Having muscle weakness.  How is this diagnosed? A migraine headache can be diagnosed based on:  Your symptoms.  A physical exam.  Tests, such as CT scan or MRI of the head. These imaging tests can help rule out other causes of headaches.  Taking fluid from the spine (lumbar puncture) and analyzing it (cerebrospinal fluid analysis, or CSF analysis).  How is this treated? A migraine headache is usually treated with medicines that:  Relieve pain.  Relieve nausea.  Prevent migraines from coming back.  Treatment may also include:  Acupuncture.  Lifestyle changes like avoiding foods that trigger migraines.  Follow these instructions at home: Medicines  Take over-the-counter and prescription medicines only as told by your health care provider.  Do not drive or use heavy machinery while taking prescription pain medicine.  To prevent or treat constipation while you are taking prescription pain medicine, your health care provider may recommend that you: ? Drink enough fluid to keep your urine clear or pale yellow. ? Take over-the-counter or prescription medicines. ? Eat foods that are high in fiber, such as fresh fruits and vegetables, whole grains, and beans. ? Limit foods that are high in fat and processed sugars, such as fried and sweet foods. Lifestyle  Avoid alcohol use.  Do not use any products that contain nicotine or tobacco, such as cigarettes and e-cigarettes. If you  need help quitting, ask your health care provider.  Get at least 8 hours of sleep every night.  Limit your stress. General instructions   Keep a journal to find out what may trigger your migraine headaches. For example, write down: ? What you eat and drink. ? How much sleep you get. ? Any change to your diet or medicines.  If you have a migraine: ? Avoid things that make your symptoms worse, such as bright lights. ? It may help to lie down in a dark, quiet room. ? Do not drive or use heavy machinery. ? Ask your health care provider what activities are safe for you while you are experiencing symptoms.  Keep all follow-up visits as told by your health care provider. This is important. Contact a health care provider if:  You develop symptoms that are different or more severe than your usual migraine symptoms. Get help right away if:  Your migraine becomes severe.  You have a fever.  You have a stiff  neck.  You have vision loss.  Your muscles feel weak or like you cannot control them.  You start to lose your balance often.  You develop trouble walking.  You faint. This information is not intended to replace advice given to you by your health care provider. Make sure you discuss any questions you have with your health care provider. Document Released: 01/03/2005 Document Revised: 07/24/2015 Document Reviewed: 06/22/2015 Elsevier Interactive Patient Education  2017 [employer].

## 2018-12-17 ENCOUNTER — Telehealth: Payer: Self-pay | Admitting: Internal Medicine

## 2018-12-17 NOTE — Telephone Encounter (Signed)
I called pt twice and left vm to call ofc. °

## 2018-12-18 ENCOUNTER — Ambulatory Visit (INDEPENDENT_AMBULATORY_CARE_PROVIDER_SITE_OTHER): Payer: Self-pay | Admitting: Internal Medicine

## 2018-12-18 ENCOUNTER — Encounter: Payer: Self-pay | Admitting: Internal Medicine

## 2018-12-18 ENCOUNTER — Other Ambulatory Visit: Payer: Self-pay

## 2018-12-18 VITALS — Ht 71.0 in | Wt 214.0 lb

## 2018-12-18 DIAGNOSIS — E559 Vitamin D deficiency, unspecified: Secondary | ICD-10-CM

## 2018-12-18 DIAGNOSIS — G43909 Migraine, unspecified, not intractable, without status migrainosus: Secondary | ICD-10-CM

## 2018-12-18 DIAGNOSIS — K219 Gastro-esophageal reflux disease without esophagitis: Secondary | ICD-10-CM

## 2018-12-18 DIAGNOSIS — F419 Anxiety disorder, unspecified: Secondary | ICD-10-CM

## 2018-12-18 DIAGNOSIS — Z1389 Encounter for screening for other disorder: Secondary | ICD-10-CM

## 2018-12-18 DIAGNOSIS — Z0000 Encounter for general adult medical examination without abnormal findings: Secondary | ICD-10-CM

## 2018-12-18 DIAGNOSIS — Z13220 Encounter for screening for lipoid disorders: Secondary | ICD-10-CM

## 2018-12-18 DIAGNOSIS — Z1329 Encounter for screening for other suspected endocrine disorder: Secondary | ICD-10-CM

## 2018-12-18 MED ORDER — PROPRANOLOL HCL ER 120 MG PO CP24: 120.0000 mg | ORAL_CAPSULE | Freq: Every day | ORAL | 3 refills | Status: DC

## 2018-12-18 MED ORDER — PROMETHAZINE HCL 25 MG PO TABS: 25.0000 mg | ORAL_TABLET | Freq: Two times a day (BID) | ORAL | 11 refills | Status: DC | PRN

## 2018-12-18 MED ORDER — SUMATRIPTAN SUCCINATE 50 MG PO TABS: ORAL_TABLET | ORAL | 11 refills | Status: DC

## 2018-12-18 NOTE — Patient Instructions (Signed)
Generalized Anxiety Disorder, Adult Generalized anxiety disorder (GAD) is a mental health disorder. People with this condition constantly worry about everyday events. Unlike normal anxiety, worry related to GAD is not triggered by a specific event. These worries also do not fade or get better with time. GAD interferes with life functions, including relationships, work, and school. GAD can vary from mild to severe. People with severe GAD can have intense waves of anxiety with physical symptoms (panic attacks). What are the causes? The exact cause of GAD is not known. What increases the risk? This condition is more likely to develop in:  Women.  People who have a family history of anxiety disorders.  People who are very shy.  People who experience very stressful life events, such as the death of a loved one.  People who have a very stressful family environment. What are the signs or symptoms? People with GAD often worry excessively about many things in their lives, such as their health and family. They may also be overly concerned about:  Doing well at work.  Being on time.  Natural disasters.  Friendships. Physical symptoms of GAD include:  Fatigue.  Muscle tension or having muscle twitches.  Trembling or feeling shaky.  Being easily startled.  Feeling like your heart is pounding or racing.  Feeling out of breath or like you cannot take a deep breath.  Having trouble falling asleep or staying asleep.  Sweating.  Nausea, diarrhea, or irritable bowel syndrome (IBS).  Headaches.  Trouble concentrating or remembering facts.  Restlessness.  Irritability. How is this diagnosed? Your health care provider can diagnose GAD based on your symptoms and medical history. You will also have a physical exam. The health care provider will ask specific questions about your symptoms, including how severe they are, when they started, and if they come and go. Your health care  provider may ask you about your use of alcohol or drugs, including prescription medicines. Your health care provider may refer you to a mental health specialist for further evaluation. Your health care provider will do a thorough examination and may perform additional tests to rule out other possible causes of your symptoms. To be diagnosed with GAD, a person must have anxiety that:  Is out of his or her control.  Affects several different aspects of his or her life, such as work and relationships.  Causes distress that makes him or her unable to take part in normal activities.  Includes at least three physical symptoms of GAD, such as restlessness, fatigue, trouble concentrating, irritability, muscle tension, or sleep problems. Before your health care provider can confirm a diagnosis of GAD, these symptoms must be present more days than they are not, and they must last for six months or longer. How is this treated? The following therapies are usually used to treat GAD:  Medicine. Antidepressant medicine is usually prescribed for long-term daily control. Antianxiety medicines may be added in severe cases, especially when panic attacks occur.  Talk therapy (psychotherapy). Certain types of talk therapy can be helpful in treating GAD by providing support, education, and guidance. Options include: ? Cognitive behavioral therapy (CBT). People learn coping skills and techniques to ease their anxiety. They learn to identify unrealistic or negative thoughts and behaviors and to replace them with positive ones. ? Acceptance and commitment therapy (ACT). This treatment teaches people how to be mindful as a way to cope with unwanted thoughts and feelings. ? Biofeedback. This process trains you to manage your body's response (  physiological response) through breathing techniques and relaxation methods. You will work with a therapist while machines are used to monitor your physical symptoms.  Stress  management techniques. These include yoga, meditation, and exercise. A mental health specialist can help determine which treatment is best for you. Some people see improvement with one type of therapy. However, other people require a combination of therapies. Follow these instructions at home:  Take over-the-counter and prescription medicines only as told by your health care provider.  Try to maintain a normal routine.  Try to anticipate stressful situations and allow extra time to manage them.  Practice any stress management or self-calming techniques as taught by your health care provider.  Do not punish yourself for setbacks or for not making progress.  Try to recognize your accomplishments, even if they are small.  Keep all follow-up visits as told by your health care provider. This is important. Contact a health care provider if:  Your symptoms do not get better.  Your symptoms get worse.  You have signs of depression, such as: ? A persistently sad, cranky, or irritable mood. ? Loss of enjoyment in activities that used to bring you joy. ? Change in weight or eating. ? Changes in sleeping habits. ? Avoiding friends or family members. ? Loss of energy for normal tasks. ? Feelings of guilt or worthlessness. Get help right away if:  You have serious thoughts about hurting yourself or others. If you ever feel like you may hurt yourself or others, or have thoughts about taking your own life, get help right away. You can go to your nearest emergency department or call:  Your local emergency services (911 in the [place]  A suicide crisis helpline, such as the National Suicide Prevention Lifeline at 1-800-273-8255. This is open 24 hours a day. Summary  Generalized anxiety disorder (GAD) is a mental health disorder that involves worry that is not triggered by a specific event.  People with GAD often worry excessively about many things in their lives, such as their health and  family.  GAD may cause physical symptoms such as restlessness, trouble concentrating, sleep problems, frequent sweating, nausea, diarrhea, headaches, and trembling or muscle twitching.  A mental health specialist can help determine which treatment is best for you. Some people see improvement with one type of therapy. However, other people require a combination of therapies. This information is not intended to replace advice given to you by your health care provider. Make sure you discuss any questions you have with your health care provider. Document Released: 04/30/2012 Document Revised: 12/16/2016 Document Reviewed: 11/24/2015 Elsevier Patient Education  2020 [employer] Inc[employer]     Living With Anxiety  After being diagnosed with an anxiety disorder, you may be relieved to know why you have felt or behaved a certain way. It is natural to also feel overwhelmed about the treatment ahead and what it will mean for your life. With care and support, you can manage this condition and recover from it. How to cope with anxiety Dealing with stress Stress is your body's reaction to life changes and events, both good and bad. Stress can last just a few hours or it can be ongoing. Stress can play a major role in anxiety, so it is important to learn both how to cope with stress and how to think about it differently. Talk with your health care provider or a counselor to learn more about stress reduction. He or she may suggest some stress reduction techniques, such as:    Music therapy. This can include creating or listening to music that you enjoy and that inspires you.  Mindfulness-based meditation. This involves being aware of your normal breaths, rather than trying to control your breathing. It can be done while sitting or walking.  Centering prayer. This is a kind of meditation that involves focusing on a word, phrase, or sacred image that is meaningful to you and that brings you peace.  Deep breathing. To  do this, expand your stomach and inhale slowly through your nose. Hold your breath for 3-5 seconds. Then exhale slowly, allowing your stomach muscles to relax.  Self-talk. This is a skill where you identify thought patterns that lead to anxiety reactions and correct those thoughts.  Muscle relaxation. This involves tensing muscles then relaxing them. Choose a stress reduction technique that fits your lifestyle and personality. Stress reduction techniques take time and practice. Set aside 5-15 minutes a day to do them. Therapists can offer training in these techniques. The training may be covered by some insurance plans. Other things you can do to manage stress include:  Keeping a stress diary. This can help you learn what triggers your stress and ways to control your response.  Thinking about how you respond to certain situations. You may not be able to control everything, but you can control your reaction.  Making time for activities that help you relax, and not feeling guilty about spending your time in this way. Therapy combined with coping and stress-reduction skills provides the best chance for successful treatment. Medicines Medicines can help ease symptoms. Medicines for anxiety include:  Anti-anxiety drugs.  Antidepressants.  Beta-blockers. Medicines may be used as the main treatment for anxiety disorder, along with therapy, or if other treatments are not working. Medicines should be prescribed by a health care provider. Relationships Relationships can play a big part in helping you recover. Try to spend more time connecting with trusted friends and family members. Consider going to couples counseling, taking family education classes, or going to family therapy. Therapy can help you and others better understand the condition. How to recognize changes in your condition Everyone has a different response to treatment for anxiety. Recovery from anxiety happens when symptoms decrease and  stop interfering with your daily activities at home or work. This may mean that you will start to:  Have better concentration and focus.  Sleep better.  Be less irritable.  Have more energy.  Have improved memory. It is important to recognize when your condition is getting worse. Contact your health care provider if your symptoms interfere with home or work and you do not feel like your condition is improving. Where to find help and support: You can get help and support from these sources:  Self-help groups.  Online and community organizations.  A trusted spiritual leader.  Couples counseling.  Family education classes.  Family therapy. Follow these instructions at home:  Eat a healthy diet that includes plenty of vegetables, fruits, whole grains, low-fat dairy products, and lean protein. Do not eat a lot of foods that are high in solid fats, added sugars, or salt.  Exercise. Most adults should do the following: ? Exercise for at least 150 minutes each week. The exercise should increase your heart rate and make you sweat (moderate-intensity exercise). ? Strengthening exercises at least twice a week.  Cut down on caffeine, tobacco, alcohol, and other potentially harmful substances.  Get the right amount and quality of sleep. Most adults need 7-9 hours of sleep each   night.  Make choices that simplify your life.  Take over-the-counter and prescription medicines only as told by your health care provider.  Avoid caffeine, alcohol, and certain over-the-counter cold medicines. These may make you feel worse. Ask your pharmacist which medicines to avoid.  Keep all follow-up visits as told by your health care provider. This is important. Questions to ask your health care provider  Would I benefit from therapy?  How often should I follow up with a health care provider?  How long do I need to take medicine?  Are there any long-term side effects of my medicine?  Are there  any alternatives to taking medicine? Contact a health care provider if:  You have a hard time staying focused or finishing daily tasks.  You spend many hours a day feeling worried about everyday life.  You become exhausted by worry.  You start to have headaches, feel tense, or have nausea.  You urinate more than normal.  You have diarrhea. Get help right away if:  You have a racing heart and shortness of breath.  You have thoughts of hurting yourself or others. If you ever feel like you may hurt yourself or others, or have thoughts about taking your own life, get help right away. You can go to your nearest emergency department or call:  Your local emergency services (911 in the [place]  A suicide crisis helpline, such as the National Suicide Prevention Lifeline at 1-800-273-8255. This is open 24-hours a day. Summary  Taking steps to deal with stress can help calm you.  Medicines cannot cure anxiety disorders, but they can help ease symptoms.  Family, friends, and partners can play a big part in helping you recover from an anxiety disorder. This information is not intended to replace advice given to you by your health care provider. Make sure you discuss any questions you have with your health care provider. Document Released: 12/29/2015 Document Revised: 12/16/2016 Document Reviewed: 12/29/2015 Elsevier Patient Education  2020 [employer]  

## 2018-12-18 NOTE — Progress Notes (Signed)
Virtual Visit via Video Note  I connected with Kevin Bowers  on 12/18/18 at 10:30 AM EST by a video enabled telemedicine application and verified that I am speaking with the correct person using two identifiers.  Location patient:work  Location provider:work or home office Persons participating in the virtual visit: patient, provider  I discussed the limitations of evaluation and management by telemedicine and the availability of in person appointments. The patient expressed understanding and agreed to proceed.   HPI: Follow up  1. Migraines doing better less in frequency on propranolol, imitrex prn and phenerghan prn  2. Genella Rife doing better taking otc nexium 20 mg qd  3. 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 self pay not had labs yet since last visit but will do in the next 1-2 weeks   ROS: See pertinent positives and negatives per HPI. General: wt 214, energy ok  HEENT: no covid 19 sx's I.e sore throat  Cv: no chest pain  Lungs: no sob  GI: gerd controlled  GU: no issues  Msk: no pain  Neuro: migraines less  Skin: no issues  Psych: + work anxiety    Past Medical History:  Diagnosis Date  . Anxiety   . GERD (gastroesophageal reflux disease)   . Migraine     Past Surgical History:  Procedure Laterality Date  . TONSILLECTOMY     1990    Family History  Problem Relation Age of Onset  . 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  esomeprazole (NEXIUM) 20 MG capsule, Take 20 mg by mouth daily at 12 noon., Disp: , Rfl:  .  promethazine (PHENERGAN) 25 MG tablet, Take 1 tablet (25 mg total) by mouth 2 (two) times daily as needed for nausea or vomiting (migraine)., Disp: 20 tablet, Rfl: 11 .  propranolol ER (INDERAL LA) 120 MG 24 hr capsule, Take 1 capsule (120 mg total) by mouth daily., Disp: 90 capsule, Rfl:  3 .  SUMAtriptan (IMITREX) 50 MG tablet, TAKE 1 TABLET BY MOUTH AS NEEDED FOR MIGRAINE, MAY REPEAT IN 2 HOURS IF NEEDED. MAX DOSE 4 TABLETS IN 1 DAY. DO NOT TAKE MORE THAN 2 TIMES PER WEEK, Disp: 10 tablet, Rfl: 11  EXAM:  VITALS per patient if applicable:  GENERAL: alert, oriented, appears well and in no acute distress  HEENT: atraumatic, conjunttiva clear, no obvious abnormalities on inspection of external nose and ears  NECK: normal movements of the head and neck  LUNGS: on inspection no signs of respiratory distress, breathing rate appears normal, no obvious gross SOB, gasping or wheezing  CV: no obvious cyanosis  MS: moves all visible extremities without noticeable abnormality  PSYCH/NEURO: pleasant and cooperative, no obvious depression or anxiety, speech and thought processing grossly intact  ASSESSMENT AND PLAN:  Discussed the following assessment and plan:  Migraine without status migrainosus, not intractable, unspecified migraine type - Plan: SUMAtriptan (IMITREX) 50 MG tablet, propranolol ER (INDERAL LA) 120 MG 24 hr capsule, promethazine (PHENERGAN) 25 MG tablet  Anxiety -disc osman given info   Gastroesophageal reflux disease, unspecified whether esophagitis present -nexium 20 mg qd prn otc   HM No flu shot  Tdap had 04/21/16 pharmacy  Hep B immune 12/2015 titer >1000  Blood exposure wants HCV, HIV not sexually active  Will check  CMET, CBC, lipid TSH, UA, vitamin D lab s [facility]  He has had colonoscopy ~2010  -we discussed possible serious and likely etiologies, options for evaluation and workup, limitations of telemedicine visit vs in person visit, treatment, treatment risks and precautions. Pt prefers to treat via telemedicine empirically rather then risking or undertaking an in person visit at this moment. Patient agrees to seek prompt in person care if worsening, new symptoms arise, or if is not improving with treatment.   I discussed the assessment and  treatment plan with the patient. The patient was provided an opportunity to ask questions and all were answered. The patient agreed with the plan and demonstrated an understanding of the instructions.   The patient was advised to call back or seek an in-person evaluation if the symptoms worsen or if the condition fails to improve as anticipated.  Time spent 25 minutes  Delorise Jackson, MD

## 2019-02-03 ENCOUNTER — Telehealth: Payer: Self-pay | Admitting: Internal Medicine

## 2019-02-03 ENCOUNTER — Other Ambulatory Visit: Payer: Self-pay | Admitting: Internal Medicine

## 2019-02-03 DIAGNOSIS — G43909 Migraine, unspecified, not intractable, without status migrainosus: Secondary | ICD-10-CM

## 2019-02-03 MED ORDER — SUMATRIPTAN SUCCINATE 50 MG PO TABS: ORAL_TABLET | ORAL | 11 refills | Status: DC

## 2019-02-03 NOTE — Telephone Encounter (Signed)
Phone in imitrex   Thanks TMS

## 2019-02-04 NOTE — Telephone Encounter (Signed)
Called Rx into pharmacy.  

## 2019-03-30 ENCOUNTER — Ambulatory Visit: Payer: Self-pay

## 2019-12-19 ENCOUNTER — Ambulatory Visit: Payer: Self-pay | Admitting: Internal Medicine

## 2020-01-29 ENCOUNTER — Ambulatory Visit: Payer: Self-pay | Admitting: Internal Medicine

## 2020-02-20 ENCOUNTER — Ambulatory Visit: Payer: Self-pay | Admitting: Internal Medicine

## 2020-03-12 LAB — OB RESULTS CONSOLE GC/CHLAMYDIA: Chlamydia: NEGATIVE

## 2020-03-12 LAB — HM HIV SCREENING LAB: HM HIV Screening: NEGATIVE

## 2020-03-18 ENCOUNTER — Ambulatory Visit: Payer: Self-pay | Admitting: Internal Medicine

## 2020-04-07 ENCOUNTER — Encounter: Payer: Self-pay | Admitting: Internal Medicine

## 2020-04-07 ENCOUNTER — Ambulatory Visit (INDEPENDENT_AMBULATORY_CARE_PROVIDER_SITE_OTHER): Payer: Self-pay | Admitting: Internal Medicine

## 2020-04-07 ENCOUNTER — Other Ambulatory Visit: Payer: Self-pay

## 2020-04-07 VITALS — BP 122/86 | HR 72 | Temp 98.0°F | Ht 71.0 in | Wt 215.2 lb

## 2020-04-07 DIAGNOSIS — G43909 Migraine, unspecified, not intractable, without status migrainosus: Secondary | ICD-10-CM

## 2020-04-07 DIAGNOSIS — R42 Dizziness and giddiness: Secondary | ICD-10-CM

## 2020-04-07 DIAGNOSIS — K219 Gastro-esophageal reflux disease without esophagitis: Secondary | ICD-10-CM

## 2020-04-07 DIAGNOSIS — Z0000 Encounter for general adult medical examination without abnormal findings: Secondary | ICD-10-CM

## 2020-04-07 DIAGNOSIS — H6993 Unspecified Eustachian tube disorder, bilateral: Secondary | ICD-10-CM

## 2020-04-07 DIAGNOSIS — F419 Anxiety disorder, unspecified: Secondary | ICD-10-CM

## 2020-04-07 MED ORDER — PROPRANOLOL HCL ER 120 MG PO CP24
120.0000 mg | ORAL_CAPSULE | Freq: Every day | ORAL | 3 refills | Status: DC
Start: 2020-04-07 — End: 2020-08-28

## 2020-04-07 MED ORDER — PROMETHAZINE HCL 25 MG PO TABS: 25.0000 mg | ORAL_TABLET | Freq: Two times a day (BID) | ORAL | 11 refills | Status: DC | PRN

## 2020-04-07 MED ORDER — ESOMEPRAZOLE MAGNESIUM 20 MG PO CPDR
20.0000 mg | DELAYED_RELEASE_CAPSULE | Freq: Every day | ORAL | 3 refills | Status: DC
Start: 2020-04-07 — End: 2020-08-28

## 2020-04-07 MED ORDER — SUMATRIPTAN SUCCINATE 50 MG PO TABS
ORAL_TABLET | ORAL | 11 refills | Status: DC
Start: 2020-04-07 — End: 2020-08-28

## 2020-04-07 NOTE — Progress Notes (Signed)
Chief Complaint  Patient presents with   Follow-up   Annual  1. C/o vertigo/dizziness and left ear discomfort denies h/o allergies he has relief band which helps  2. BP elevated no h/o HTN will monitor  3. Anxiety improved on wellbutrin 150 mg xl and prozac 20 mg qd given list of therapist  4. Migraines controlled with phenerghan 25 mg qd, inderal la 120 mg qd, imitrex 50 mg qd needs refills     Review of Systems  Constitutional: Negative for weight loss.  HENT: Positive for ear pain. Negative for hearing loss.   Eyes: Negative for blurred vision.  Respiratory: Negative for shortness of breath.   Cardiovascular: Negative for chest pain.  Gastrointestinal: Negative for abdominal pain.  Musculoskeletal: Negative for falls and joint pain.  Skin: Negative for rash.  Neurological: Positive for dizziness.  Psychiatric/Behavioral: The patient is not nervous/anxious.    Past Medical History:  Diagnosis Date   Anxiety    GERD (gastroesophageal reflux disease)    Migraine    Past Surgical History:  Procedure Laterality Date   TONSILLECTOMY     61   Family History  Problem Relation Age of Onset   Iron deficiency Father    Social History   Socioeconomic History   Marital status: Single    Spouse name: Not on file   Number of children: Not on file   Years of education: Not on file   Highest education level: Not on file  Occupational History   Not on file  Tobacco Use   Smoking status: Never Smoker   Smokeless tobacco: Never Used  Substance and Sexual Activity   Alcohol use: Yes    Comment: occas   Drug use: No   Sexual activity: Never  Other Topics Concern   Not on file  Social History Narrative   Undergraduate degree    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   FLUoxetine (PROZAC) 20 MG tablet Take 20 mg by mouth daily.   [DISCONTINUED] esomeprazole (NEXIUM) 20 MG capsule Take 20 mg by mouth daily at 12 noon.   [DISCONTINUED] promethazine (PHENERGAN) 25 MG tablet Take 1 tablet (25 mg total) by mouth 2 (two) times daily as needed for nausea or vomiting (migraine).   [DISCONTINUED] propranolol ER (INDERAL LA) 120 MG 24 hr capsule Take 1 capsule (120 mg total) by mouth daily.   [DISCONTINUED] SUMAtriptan (IMITREX) 50 MG tablet TAKE 1 TABLET BY MOUTH AS NEEDED FOR MIGRAINE, MAY REPEAT IN 2 HOURS IF NEEDED. MAX DOSE 4 TABLETS IN 1 DAY. DO NOT TAKE MORE THAN 2 TIMES PER WEEK   Allergies  Allergen Reactions   Sulfa Antibiotics Anaphylaxis and Shortness Of Breath   No results found for this or any previous visit (from the past 2160 hour(s)). Objective  Body mass index is 30.01 kg/m. Wt Readings from Last 3 Encounters:  04/07/20 215 lb 3.2 oz (97.6 kg)  12/18/18 214 lb (97.1 kg)  12/08/17 219 lb 3.2  oz (99.4 kg)   Temp Readings from Last 3 Encounters:  04/07/20 98 F (36.7 C) (Oral)  12/08/17 98.7 F (37.1 C) (Oral)  03/27/17 99 F (37.2 C) (Oral)   BP Readings from Last 3 Encounters:  04/07/20 122/86  12/08/17 128/80  03/27/17 126/88   Pulse Readings from Last 3 Encounters:  04/07/20 72  12/08/17 94  03/27/17 90    Physical Exam Vitals and nursing note reviewed.  Constitutional:      Appearance: Normal appearance. He is well-developed and well-groomed. He is obese.  HENT:     Head: Normocephalic and atraumatic.     Ears:     Comments: Fluid in b/l ears   Eyes:     Conjunctiva/sclera: Conjunctivae normal.     Pupils: Pupils are equal, round, and reactive to light.  Cardiovascular:      Rate and Rhythm: Normal rate and regular rhythm.     Heart sounds: Normal heart sounds. No murmur heard.   Pulmonary:     Effort: Pulmonary effort is normal.     Breath sounds: Normal breath sounds.  Abdominal:     Tenderness: There is no abdominal tenderness.  Skin:    General: Skin is warm and moist.  Neurological:     General: No focal deficit present.     Mental Status: He is alert and oriented to person, place, and time. Mental status is at baseline.     Gait: Gait normal.  Psychiatric:        Attention and Perception: Attention and perception normal.        Mood and Affect: Mood and affect normal.        Speech: Speech normal.        Behavior: Behavior normal. Behavior is cooperative.        Thought Content: Thought content normal.        Cognition and Memory: Cognition and memory normal.        Judgment: Judgment normal.     Assessment  Plan  Annual physical exam - Plan: Comprehensive metabolic panel, Lipid panel, CBC with Differential/Platelet, TSH, Urinalysis, Routine w reflex microscopic [facility] or any lab now No flu shot  Pfizer 2/2 consider booster Tdap had 04/21/16 pharmacy  Hep B immune 12/2015 titer >1000 Blood exposure wants HCV, HIVnot sexually active  Will check CMET, CBC, lipid TSH, UA,labs [facility] not had labs in 3 years as of 04/07/20  He has had colonoscopy ~2010 disc age 89 due  rec healthy diet and exercise   Migraine without status migrainosus, not intractable, unspecified migraine type - Plan: propranolol ER (INDERAL LA) 120 MG 24 hr capsule, SUMAtriptan (IMITREX) 50 MG tablet, promethazine (PHENERGAN) 25 MG tablet Vertigo-->if continues consider MRI brain IACS with and w/o contrast  Eustachian tube disorder, bilateral  Disc otc antihistamines and ns, flonase   Gastroesophageal reflux disease without esophagitis - Plan: esomeprazole (NEXIUM) 20 MG capsule  Anxiety  Controlled on prozac 20 mg and wellbutrim 150 mg qd  Given list of therapist has  psych    Provider: Dr. French Ana McLean-Scocuzza-[facility]

## 2020-04-07 NOTE — Patient Instructions (Addendum)
[facility] [place]  [address] [place] [address] [phone]   [facility] [place]  [address]  [place] [address]  [phone]  Any lab now [address] [phone] or [facility]    Keratosis pilaris to back   Monitor Blood pressure goal <130/<80 H/a, lightheaded/dizziness check BP     Eustachian Tube Dysfunction  Eustachian tube dysfunction refers to a condition in which a blockage develops in the narrow passage that connects the middle ear to the back of the nose (eustachian tube). The eustachian tube regulates air pressure in the middle ear by letting air move between the ear and nose. It also helps to drain fluid from the middle ear space. Eustachian tube dysfunction can affect one or both ears. When the eustachian tube does not function properly, air pressure, fluid, or both can build up in the middle ear. What are the causes? This condition occurs when the eustachian tube becomes blocked or cannot open normally. Common causes of this condition include:  Ear infections.  Colds and other infections that affect the nose, mouth, and throat (upper respiratory tract).  Allergies.  Irritation from cigarette smoke.  Irritation from stomach acid coming up into the esophagus (gastroesophageal reflux). The esophagus is the tube that carries food from the mouth to the stomach.  Sudden changes in air pressure, such as from descending in an airplane or scuba diving.  Abnormal growths in the nose or throat, such as: ? Growths that line the nose (nasal polyps). ? Abnormal growth of cells (tumors). ? Enlarged tissue at the back of the throat (adenoids). What increases the risk? You are more likely to develop this condition if:  You smoke.  You are overweight.  You are a child who has: ? Certain birth defects of the mouth, such as cleft palate. ? Large tonsils or  adenoids. What are the signs or symptoms? Common symptoms of this condition include:  A feeling of fullness in the ear.  Ear pain.  Clicking or popping noises in the ear.  Ringing in the ear.  Hearing loss.  Loss of balance.  Dizziness. Symptoms may get worse when the air pressure around you changes, such as when you travel to an area of high elevation, fly on an airplane, or go scuba diving. How is this diagnosed? This condition may be diagnosed based on:  Your symptoms.  A physical exam of your ears, nose, and throat.  Tests, such as those that measure: ? The movement of your eardrum (tympanogram). ? Your hearing (audiometry). How is this treated? Treatment depends on the cause and severity of your condition.  In mild cases, you may relieve your symptoms by moving air into your ears. This is called "popping the ears."  In more severe cases, or if you have symptoms of fluid in your ears, treatment may include: ? Medicines to relieve congestion (decongestants). ? Medicines that treat allergies (antihistamines). ? Nasal sprays or ear drops that contain medicines that reduce swelling (steroids). ? A procedure to drain the fluid in your eardrum (myringotomy). In this procedure, a small tube is placed in the eardrum to:  Drain the fluid.  Restore the air in the middle ear space. ? A procedure to insert a balloon device through the nose to inflate the opening of the eustachian tube (balloon dilation). Follow these instructions at home: Lifestyle  Do not do any of the following until your health care  provider approves: ? Travel to high altitudes. ? Fly in airplanes. ? Work in a Estate agentpressurized cabin or room. ? Scuba dive.  Do not use any products that contain nicotine or tobacco, such as cigarettes and e-cigarettes. If you need help quitting, ask your health care provider.  Keep your ears dry. Wear fitted earplugs during showering and bathing. Dry your ears completely  after. General instructions  Take over-the-counter and prescription medicines only as told by your health care provider.  Use techniques to help pop your ears as recommended by your health care provider. These may include: ? Chewing gum. ? Yawning. ? Frequent, forceful swallowing. ? Closing your mouth, holding your nose closed, and gently blowing as if you are trying to blow air out of your nose.  Keep all follow-up visits as told by your health care provider. This is important. Contact a health care provider if:  Your symptoms do not go away after treatment.  Your symptoms come back after treatment.  You are unable to pop your ears.  You have: ? A fever. ? Pain in your ear. ? Pain in your head or neck. ? Fluid draining from your ear.  Your hearing suddenly changes.  You become very dizzy.  You lose your balance. Summary  Eustachian tube dysfunction refers to a condition in which a blockage develops in the eustachian tube.  It can be caused by ear infections, allergies, inhaled irritants, or abnormal growths in the nose or throat.  Symptoms include ear pain, hearing loss, or ringing in the ears.  Mild cases are treated with maneuvers to unblock the ears, such as yawning or ear popping.  Severe cases are treated with medicines. Surgery may also be done (rare). This information is not intended to replace advice given to you by your health care provider. Make sure you discuss any questions you have with your health care provider. Document Revised: 04/25/2017 Document Reviewed: 04/25/2017 Elsevier Patient Education  2021 Elsevier Inc.   How to Take Your Blood Pressure Blood pressure is a measurement of how strongly your blood is pressing against the walls of your arteries. Arteries are blood vessels that carry blood from your heart throughout your body. Your health care provider takes your blood pressure at each office visit. You can also take your own blood pressure at  home with a blood pressure monitor. You may need to take your own blood pressure to:  Confirm a diagnosis of high blood pressure (hypertension).  Monitor your blood pressure over time.  Make sure your blood pressure medicine is working. Supplies needed:  Blood pressure monitor.  Dining room chair to sit in.  Table or desk.  Small notebook and pencil or pen. How to prepare To get the most accurate reading, avoid the following for 30 minutes before you check your blood pressure:  Drinking caffeine.  Drinking alcohol.  Eating.  Smoking.  Exercising. Five minutes before you check your blood pressure:  Use the bathroom and urinate so that you have an empty bladder.  Sit quietly in a dining room chair. Do not sit in a soft couch or an armchair. Do not talk. How to take your blood pressure To check your blood pressure, follow the instructions in the manual that came with your blood pressure monitor. If you have a digital blood pressure monitor, the instructions may be as follows: 1. Sit up straight in a chair. 2. Place your feet on the floor. Do not cross your ankles or legs. 3. Rest your  left arm at the level of your heart on a table or desk or on the arm of a chair. 4. Pull up your shirt sleeve. 5. Wrap the blood pressure cuff around the upper part of your left arm, 1 inch (2.5 cm) above your elbow. It is best to wrap the cuff around bare skin. 6. Fit the cuff snugly around your arm. You should be able to place only one finger between the cuff and your arm. 7. Position the cord so that it rests in the bend of your elbow. 8. Press the power button. 9. Sit quietly while the cuff inflates and deflates. 10. Read the digital reading on the monitor screen and write the numbers down (record them) in a notebook. 11. Wait 2-3 minutes, then repeat the steps, starting at step 1.   What does my blood pressure reading mean? A blood pressure reading consists of a higher number over a  lower number. Ideally, your blood pressure should be below 120/80. The first ("top") number is called the systolic pressure. It is a measure of the pressure in your arteries as your heart beats. The second ("bottom") number is called the diastolic pressure. It is a measure of the pressure in your arteries as the heart relaxes. Blood pressure is classified into five stages. The following are the stages for adults who do not have a short-term serious illness or a chronic condition. Systolic pressure and diastolic pressure are measured in a unit called mm Hg (millimeters of mercury).  Normal  Systolic pressure: below 120.  Diastolic pressure: below 80. Elevated  Systolic pressure: 120-129.  Diastolic pressure: below 80. Hypertension stage 1  Systolic pressure: 130-139.  Diastolic pressure: 80-89. Hypertension stage 2  Systolic pressure: 140 or above.  Diastolic pressure: 90 or above. You can have elevated blood pressure or hypertension even if only the systolic or only the diastolic number in your reading is higher than normal. Follow these instructions at home:  Check your blood pressure as often as recommended by your health care provider.  Check your blood pressure at the same time every day.  Take your monitor to the next appointment with your health care provider to make sure that: ? You are using it correctly. ? It provides accurate readings.  Be sure you understand what your goal blood pressure numbers are.  Tell your health care provider if you are having any side effects from blood pressure medicine.  Keep all follow-up visits as told by your health care provider. This is important. General tips  Your health care provider can suggest a reliable monitor that will meet your needs. There are several types of home blood pressure monitors.  Choose a monitor that has an arm cuff. Do not choose a monitor that measures your blood pressure from your wrist or finger.  Choose a  cuff that wraps snugly around your upper arm. You should be able to fit only one finger between your arm and the cuff.  You can buy a blood pressure monitor at most drugstores or online. Where to find more information American Heart Association: www.heart.org Contact a health care provider if:  Your blood pressure is consistently high. Get help right away if:  Your systolic blood pressure is higher than 180.  Your diastolic blood pressure is higher than 120. Summary  Blood pressure is a measurement of how strongly your blood is pressing against the walls of your arteries.  A blood pressure reading consists of a higher number over a  lower number. Ideally, your blood pressure should be below 120/80.  Check your blood pressure at the same time every day.  Avoid caffeine, alcohol, smoking, and exercise for 30 minutes prior to checking your blood pressure. These agents can affect the accuracy of the blood pressure reading. This information is not intended to replace advice given to you by your health care provider. Make sure you discuss any questions you have with your health care provider. Document Revised: 12/28/2018 Document Reviewed: 12/28/2018 Elsevier Patient Education  2021 Elsevier Inc.  Hypertension, Adult High blood pressure (hypertension) is when the force of blood pumping through the arteries is too strong. The arteries are the blood vessels that carry blood from the heart throughout the body. Hypertension forces the heart to work harder to pump blood and may cause arteries to become narrow or stiff. Untreated or uncontrolled hypertension can cause a heart attack, heart failure, a stroke, kidney disease, and other problems. A blood pressure reading consists of a higher number over a lower number. Ideally, your blood pressure should be below 120/80. The first ("top") number is called the systolic pressure. It is a measure of the pressure in your arteries as your heart beats. The  second ("bottom") number is called the diastolic pressure. It is a measure of the pressure in your arteries as the heart relaxes. What are the causes? The exact cause of this condition is not known. There are some conditions that result in or are related to high blood pressure. What increases the risk? Some risk factors for high blood pressure are under your control. The following factors may make you more likely to develop this condition:  Smoking.  Having type 2 diabetes mellitus, high cholesterol, or both.  Not getting enough exercise or physical activity.  Being overweight.  Having too much fat, sugar, calories, or salt (sodium) in your diet.  Drinking too much alcohol. Some risk factors for high blood pressure may be difficult or impossible to change. Some of these factors include:  Having chronic kidney disease.  Having a family history of high blood pressure.  Age. Risk increases with age.  Race. You may be at higher risk if you are African American.  Gender. Men are at higher risk than women before age 79. After age 65, women are at higher risk than men.  Having obstructive sleep apnea.  Stress. What are the signs or symptoms? High blood pressure may not cause symptoms. Very high blood pressure (hypertensive crisis) may cause:  Headache.  Anxiety.  Shortness of breath.  Nosebleed.  Nausea and vomiting.  Vision changes.  Severe chest pain.  Seizures. How is this diagnosed? This condition is diagnosed by measuring your blood pressure while you are seated, with your arm resting on a flat surface, your legs uncrossed, and your feet flat on the floor. The cuff of the blood pressure monitor will be placed directly against the skin of your upper arm at the level of your heart. It should be measured at least twice using the same arm. Certain conditions can cause a difference in blood pressure between your right and left arms. Certain factors can cause blood  pressure readings to be lower or higher than normal for a short period of time:  When your blood pressure is higher when you are in a health care provider's office than when you are at home, this is called white coat hypertension. Most people with this condition do not need medicines.  When your blood pressure is higher  at home than when you are in a health care provider's office, this is called masked hypertension. Most people with this condition may need medicines to control blood pressure. If you have a high blood pressure reading during one visit or you have normal blood pressure with other risk factors, you may be asked to:  Return on a different day to have your blood pressure checked again.  Monitor your blood pressure at home for 1 week or longer. If you are diagnosed with hypertension, you may have other blood or imaging tests to help your health care provider understand your overall risk for other conditions. How is this treated? This condition is treated by making healthy lifestyle changes, such as eating healthy foods, exercising more, and reducing your alcohol intake. Your health care provider may prescribe medicine if lifestyle changes are not enough to get your blood pressure under control, and if:  Your systolic blood pressure is above 130.  Your diastolic blood pressure is above 80. Your personal target blood pressure may vary depending on your medical conditions, your age, and other factors. Follow these instructions at home: Eating and drinking  Eat a diet that is high in fiber and potassium, and low in sodium, added sugar, and fat. An example eating plan is called the DASH (Dietary Approaches to Stop Hypertension) diet. To eat this way: ? Eat plenty of fresh fruits and vegetables. Try to fill one half of your plate at each meal with fruits and vegetables. ? Eat whole grains, such as whole-wheat pasta, brown rice, or whole-grain bread. Fill about one fourth of your plate  with whole grains. ? Eat or drink low-fat dairy products, such as skim milk or low-fat yogurt. ? Avoid fatty cuts of meat, processed or cured meats, and poultry with skin. Fill about one fourth of your plate with lean proteins, such as fish, chicken without skin, beans, eggs, or tofu. ? Avoid pre-made and processed foods. These tend to be higher in sodium, added sugar, and fat.  Reduce your daily sodium intake. Most people with hypertension should eat less than 1,500 mg of sodium a day.  Do not drink alcohol if: ? Your health care provider tells you not to drink. ? You are pregnant, may be pregnant, or are planning to become pregnant.  If you drink alcohol: ? Limit how much you use to:  0-1 drink a day for women.  0-2 drinks a day for men. ? Be aware of how much alcohol is in your drink. In the [place] one drink equals one 12 oz bottle of beer (355 mL), one 5 oz glass of wine (148 mL), or one 1 oz glass of hard liquor (44 mL).   Lifestyle  Work with your health care provider to maintain a healthy body weight or to lose weight. Ask what an ideal weight is for you.  Get at least 30 minutes of exercise most days of the week. Activities may include walking, swimming, or biking.  Include exercise to strengthen your muscles (resistance exercise), such as Pilates or lifting weights, as part of your weekly exercise routine. Try to do these types of exercises for 30 minutes at least 3 days a week.  Do not use any products that contain nicotine or tobacco, such as cigarettes, e-cigarettes, and chewing tobacco. If you need help quitting, ask your health care provider.  Monitor your blood pressure at home as told by your health care provider.  Keep all follow-up visits as told by your health  care provider. This is important.   Medicines  Take over-the-counter and prescription medicines only as told by your health care provider. Follow directions carefully. Blood pressure medicines must be taken  as prescribed.  Do not skip doses of blood pressure medicine. Doing this puts you at risk for problems and can make the medicine less effective.  Ask your health care provider about side effects or reactions to medicines that you should watch for. Contact a health care provider if you:  Think you are having a reaction to a medicine you are taking.  Have headaches that keep coming back (recurring).  Feel dizzy.  Have swelling in your ankles.  Have trouble with your vision. Get help right away if you:  Develop a severe headache or confusion.  Have unusual weakness or numbness.  Feel faint.  Have severe pain in your chest or abdomen.  Vomit repeatedly.  Have trouble breathing. Summary  Hypertension is when the force of blood pumping through your arteries is too strong. If this condition is not controlled, it may put you at risk for serious complications.  Your personal target blood pressure may vary depending on your medical conditions, your age, and other factors. For most people, a normal blood pressure is less than 120/80.  Hypertension is treated with lifestyle changes, medicines, or a combination of both. Lifestyle changes include losing weight, eating a healthy, low-sodium diet, exercising more, and limiting alcohol. This information is not intended to replace advice given to you by your health care provider. Make sure you discuss any questions you have with your health care provider. Document Revised: 09/13/2017 Document Reviewed: 09/13/2017 Elsevier Patient Education  2021 Elsevier Inc.  DASH Eating Plan DASH stands for Dietary Approaches to Stop Hypertension. The DASH eating plan is a healthy eating plan that has been shown to:  Reduce high blood pressure (hypertension).  Reduce your risk for type 2 diabetes, heart disease, and stroke.  Help with weight loss. What are tips for following this plan? Reading food labels  Check food labels for the amount of salt  (sodium) per serving. Choose foods with less than 5 percent of the Daily Value of sodium. Generally, foods with less than 300 milligrams (mg) of sodium per serving fit into this eating plan.  To find whole grains, look for the word "whole" as the first word in the ingredient list. Shopping  Buy products labeled as "low-sodium" or "no salt added."  Buy fresh foods. Avoid canned foods and pre-made or frozen meals. Cooking  Avoid adding salt when cooking. Use salt-free seasonings or herbs instead of table salt or sea salt. Check with your health care provider or pharmacist before using salt substitutes.  Do not fry foods. Cook foods using healthy methods such as baking, boiling, grilling, roasting, and broiling instead.  Cook with heart-healthy oils, such as olive, canola, avocado, soybean, or sunflower oil. Meal planning  Eat a balanced diet that includes: ? 4 or more servings of fruits and 4 or more servings of vegetables each day. Try to fill one-half of your plate with fruits and vegetables. ? 6-8 servings of whole grains each day. ? Less than 6 oz (170 g) of lean meat, poultry, or fish each day. A 3-oz (85-g) serving of meat is about the same size as a deck of cards. One egg equals 1 oz (28 g). ? 2-3 servings of low-fat dairy each day. One serving is 1 cup (237 mL). ? 1 serving of nuts, seeds, or beans 5  times each week. ? 2-3 servings of heart-healthy fats. Healthy fats called omega-3 fatty acids are found in foods such as walnuts, flaxseeds, fortified milks, and eggs. These fats are also found in cold-water fish, such as sardines, salmon, and mackerel.  Limit how much you eat of: ? Canned or prepackaged foods. ? Food that is high in trans fat, such as some fried foods. ? Food that is high in saturated fat, such as fatty meat. ? Desserts and other sweets, sugary drinks, and other foods with added sugar. ? Full-fat dairy products.  Do not salt foods before eating.  Do not eat more  than 4 egg yolks a week.  Try to eat at least 2 vegetarian meals a week.  Eat more home-cooked food and less restaurant, buffet, and fast food.   Lifestyle  When eating at a restaurant, ask that your food be prepared with less salt or no salt, if possible.  If you drink alcohol: ? Limit how much you use to:  0-1 drink a day for women who are not pregnant.  0-2 drinks a day for men. ? Be aware of how much alcohol is in your drink. In the [place] one drink equals one 12 oz bottle of beer (355 mL), one 5 oz glass of wine (148 mL), or one 1 oz glass of hard liquor (44 mL). General information  Avoid eating more than 2,300 mg of salt a day. If you have hypertension, you may need to reduce your sodium intake to 1,500 mg a day.  Work with your health care provider to maintain a healthy body weight or to lose weight. Ask what an ideal weight is for you.  Get at least 30 minutes of exercise that causes your heart to beat faster (aerobic exercise) most days of the week. Activities may include walking, swimming, or biking.  Work with your health care provider or dietitian to adjust your eating plan to your individual calorie needs. What foods should I eat? Fruits All fresh, dried, or frozen fruit. Canned fruit in natural juice (without added sugar). Vegetables Fresh or frozen vegetables (raw, steamed, roasted, or grilled). Low-sodium or reduced-sodium tomato and vegetable juice. Low-sodium or reduced-sodium tomato sauce and tomato paste. Low-sodium or reduced-sodium canned vegetables. Grains Whole-grain or whole-wheat bread. Whole-grain or whole-wheat pasta. Brown rice. Orpah Cobb. Bulgur. Whole-grain and low-sodium cereals. Pita bread. Low-fat, low-sodium crackers. Whole-wheat flour tortillas. Meats and other proteins Skinless chicken or [place]. Ground chicken or [place]. Pork with fat trimmed off. Fish and seafood. Egg whites. Dried beans, peas, or lentils. Unsalted nuts, nut butters, and  seeds. Unsalted canned beans. Lean cuts of beef with fat trimmed off. Low-sodium, lean precooked or cured meat, such as sausages or meat loaves. Dairy Low-fat (1%) or fat-free (skim) milk. Reduced-fat, low-fat, or fat-free cheeses. Nonfat, low-sodium ricotta or cottage cheese. Low-fat or nonfat yogurt. Low-fat, low-sodium cheese. Fats and oils Soft margarine without trans fats. Vegetable oil. Reduced-fat, low-fat, or light mayonnaise and salad dressings (reduced-sodium). Canola, safflower, olive, avocado, soybean, and sunflower oils. Avocado. Seasonings and condiments Herbs. Spices. Seasoning mixes without salt. Other foods Unsalted popcorn and pretzels. Fat-free sweets. The items listed above may not be a complete list of foods and beverages you can eat. Contact a dietitian for more information. What foods should I avoid? Fruits Canned fruit in a light or heavy syrup. Fried fruit. Fruit in cream or butter sauce. Vegetables Creamed or fried vegetables. Vegetables in a cheese sauce. Regular canned vegetables (not low-sodium or  reduced-sodium). Regular canned tomato sauce and paste (not low-sodium or reduced-sodium). Regular tomato and vegetable juice (not low-sodium or reduced-sodium). Rosita Fire. Olives. Grains Baked goods made with fat, such as croissants, muffins, or some breads. Dry pasta or rice meal packs. Meats and other proteins Fatty cuts of meat. Ribs. Fried meat. Tomasa Blase. Bologna, salami, and other precooked or cured meats, such as sausages or meat loaves. Fat from the back of a pig (fatback). Bratwurst. Salted nuts and seeds. Canned beans with added salt. Canned or smoked fish. Whole eggs or egg yolks. Chicken or [place] with skin. Dairy Whole or 2% milk, cream, and half-and-half. Whole or full-fat cream cheese. Whole-fat or sweetened yogurt. Full-fat cheese. Nondairy creamers. Whipped toppings. Processed cheese and cheese spreads. Fats and oils Butter. Stick margarine. Lard. Shortening.  Ghee. Bacon fat. Tropical oils, such as coconut, palm kernel, or palm oil. Seasonings and condiments Onion salt, garlic salt, seasoned salt, table salt, and sea salt. Worcestershire sauce. Tartar sauce. Barbecue sauce. Teriyaki sauce. Soy sauce, including reduced-sodium. Steak sauce. Canned and packaged gravies. Fish sauce. Oyster sauce. Cocktail sauce. Store-bought horseradish. Ketchup. Mustard. Meat flavorings and tenderizers. Bouillon cubes. Hot sauces. Pre-made or packaged marinades. Pre-made or packaged taco seasonings. Relishes. Regular salad dressings. Other foods Salted popcorn and pretzels. The items listed above may not be a complete list of foods and beverages you should avoid. Contact a dietitian for more information. Where to find more information  National Heart, Lung, and Blood Institute: PopSteam.is  American Heart Association: www.heart.org  Academy of Nutrition and Dietetics: www.eatright.org  National Kidney Foundation: www.kidney.org Summary  The DASH eating plan is a healthy eating plan that has been shown to reduce high blood pressure (hypertension). It may also reduce your risk for type 2 diabetes, heart disease, and stroke.  When on the DASH eating plan, aim to eat more fresh fruits and vegetables, whole grains, lean proteins, low-fat dairy, and heart-healthy fats.  With the DASH eating plan, you should limit salt (sodium) intake to 2,300 mg a day. If you have hypertension, you may need to reduce your sodium intake to 1,500 mg a day.  Work with your health care provider or dietitian to adjust your eating plan to your individual calorie needs. This information is not intended to replace advice given to you by your health care provider. Make sure you discuss any questions you have with your health care provider. Document Revised: 12/07/2018 Document Reviewed: 12/07/2018 Elsevier Patient Education  2021 [employer].

## 2020-04-08 ENCOUNTER — Encounter: Payer: Self-pay | Admitting: Internal Medicine

## 2020-04-08 DIAGNOSIS — H6993 Unspecified Eustachian tube disorder, bilateral: Secondary | ICD-10-CM | POA: Insufficient documentation

## 2020-08-27 ENCOUNTER — Encounter: Payer: Self-pay | Admitting: Internal Medicine

## 2020-08-28 ENCOUNTER — Other Ambulatory Visit: Payer: Self-pay | Admitting: Internal Medicine

## 2020-08-28 DIAGNOSIS — K219 Gastro-esophageal reflux disease without esophagitis: Secondary | ICD-10-CM

## 2020-08-28 DIAGNOSIS — G43909 Migraine, unspecified, not intractable, without status migrainosus: Secondary | ICD-10-CM

## 2020-08-28 MED ORDER — PROPRANOLOL HCL ER 120 MG PO CP24
120.0000 mg | ORAL_CAPSULE | Freq: Every day | ORAL | 3 refills | Status: DC
Start: 2020-08-28 — End: 2021-03-10

## 2020-08-28 MED ORDER — ESOMEPRAZOLE MAGNESIUM 20 MG PO CPDR: 20.0000 mg | DELAYED_RELEASE_CAPSULE | Freq: Every day | ORAL | 3 refills | Status: DC

## 2020-08-28 MED ORDER — SUMATRIPTAN SUCCINATE 50 MG PO TABS: ORAL_TABLET | ORAL | 11 refills | Status: DC

## 2020-08-28 MED ORDER — PROMETHAZINE HCL 25 MG PO TABS
25.0000 mg | ORAL_TABLET | Freq: Two times a day (BID) | ORAL | 11 refills | Status: DC | PRN
Start: 2020-08-28 — End: 2021-04-08

## 2020-08-28 NOTE — Telephone Encounter (Signed)
Patient last seen 04/07/20 and script was printed. Okay to re-send?

## 2021-03-09 ENCOUNTER — Other Ambulatory Visit: Payer: Self-pay | Admitting: Internal Medicine

## 2021-03-09 DIAGNOSIS — G43909 Migraine, unspecified, not intractable, without status migrainosus: Secondary | ICD-10-CM

## 2021-03-29 ENCOUNTER — Other Ambulatory Visit: Payer: Self-pay | Admitting: Internal Medicine

## 2021-03-30 ENCOUNTER — Other Ambulatory Visit: Payer: Self-pay | Admitting: Internal Medicine

## 2021-03-30 ENCOUNTER — Encounter: Payer: Self-pay | Admitting: Internal Medicine

## 2021-03-30 DIAGNOSIS — E559 Vitamin D deficiency, unspecified: Secondary | ICD-10-CM | POA: Insufficient documentation

## 2021-03-30 MED ORDER — CHOLECALCIFEROL 1.25 MG (50000 UT) PO CAPS: 50000.0000 [IU] | ORAL_CAPSULE | ORAL | 1 refills | Status: DC

## 2021-03-31 ENCOUNTER — Encounter: Payer: Self-pay | Admitting: Internal Medicine

## 2021-03-31 NOTE — Telephone Encounter (Signed)
Albumin sl elevated make sure drinking water  ?Liver kidneys normal ?Urine ok  ?Hep C reactive pending further testing to see if true + or false + ?Vitamin D low sent to pharmacy  ?Blood cts normal  ?Thyroid normal  ?Hiv negative  ?Written by Bevelyn Buckles, MD on 03/30/2021  9:29 AM EDT ?Seen by patient Denton Lank on 03/30/2021 12:10 PM ?

## 2021-03-31 NOTE — Telephone Encounter (Signed)
For your information  

## 2021-04-01 LAB — CBC WITH DIFFERENTIAL/PLATELET
Basophils Absolute: 0 10*3/uL (ref 0.0–0.2)
Basos: 0 %
EOS (ABSOLUTE): 0.1 10*3/uL (ref 0.0–0.4)
Eos: 2 %
Hematocrit: 40.5 % (ref 37.5–51.0)
Hemoglobin: 14 g/dL (ref 13.0–17.7)
Immature Grans (Abs): 0 10*3/uL (ref 0.0–0.1)
Immature Granulocytes: 1 %
Lymphocytes Absolute: 1.5 10*3/uL (ref 0.7–3.1)
Lymphs: 26 %
MCH: 31.9 pg (ref 26.6–33.0)
MCHC: 34.6 g/dL (ref 31.5–35.7)
MCV: 92 fL (ref 79–97)
Monocytes Absolute: 0.4 10*3/uL (ref 0.1–0.9)
Monocytes: 8 %
Neutrophils Absolute: 3.7 10*3/uL (ref 1.4–7.0)
Neutrophils: 63 %
Platelets: 259 10*3/uL (ref 150–450)
RBC: 4.39 x10E6/uL (ref 4.14–5.80)
RDW: 11.8 % (ref 11.6–15.4)
WBC: 5.9 10*3/uL (ref 3.4–10.8)

## 2021-04-01 LAB — HCV RT-PCR, QUANT (NON-GRAPH): Hepatitis C Quantitation: NOT DETECTED IU/mL

## 2021-04-01 LAB — COMPREHENSIVE METABOLIC PANEL
ALT: 34 IU/L (ref 0–44)
AST: 25 IU/L (ref 0–40)
Albumin/Globulin Ratio: 2.6 — ABNORMAL HIGH (ref 1.2–2.2)
Albumin: 5 g/dL (ref 4.0–5.0)
Alkaline Phosphatase: 83 IU/L (ref 44–121)
BUN/Creatinine Ratio: 11 (ref 9–20)
BUN: 10 mg/dL (ref 6–20)
Bilirubin Total: 0.4 mg/dL (ref 0.0–1.2)
CO2: 22 mmol/L (ref 20–29)
Calcium: 9.4 mg/dL (ref 8.7–10.2)
Chloride: 97 mmol/L (ref 96–106)
Creatinine, Ser: 0.93 mg/dL (ref 0.76–1.27)
Globulin, Total: 1.9 g/dL (ref 1.5–4.5)
Glucose: 88 mg/dL (ref 70–99)
Potassium: 4.2 mmol/L (ref 3.5–5.2)
Sodium: 137 mmol/L (ref 134–144)
Total Protein: 6.9 g/dL (ref 6.0–8.5)
eGFR: 108 mL/min/{1.73_m2} (ref >59)

## 2021-04-01 LAB — URINALYSIS, ROUTINE W REFLEX MICROSCOPIC
Bilirubin, UA: NEGATIVE
Glucose, UA: NEGATIVE
Ketones, UA: NEGATIVE
Leukocytes,UA: NEGATIVE
Nitrite, UA: NEGATIVE
Protein,UA: NEGATIVE
RBC, UA: NEGATIVE
Specific Gravity, UA: <=1.005 — AB (ref 1.005–1.030)
Urobilinogen, Ur: 0.2 mg/dL (ref 0.2–1.0)
pH, UA: 7 (ref 5.0–7.5)

## 2021-04-01 LAB — HIV ANTIBODY (ROUTINE TESTING W REFLEX): HIV Screen 4th Generation wRfx: NONREACTIVE

## 2021-04-01 LAB — VITAMIN D 25 HYDROXY (VIT D DEFICIENCY, FRACTURES): Vit D, 25-Hydroxy: 17.9 ng/mL — ABNORMAL LOW (ref 30.0–100.0)

## 2021-04-01 LAB — HCV AB W REFLEX TO QUANT PCR: HCV Ab: REACTIVE — AB

## 2021-04-01 LAB — TSH: TSH: 1.73 u[IU]/mL (ref 0.450–4.500)

## 2021-04-07 ENCOUNTER — Encounter: Payer: Self-pay | Admitting: Internal Medicine

## 2021-04-07 ENCOUNTER — Other Ambulatory Visit: Payer: Self-pay

## 2021-04-08 ENCOUNTER — Other Ambulatory Visit: Payer: Self-pay

## 2021-04-08 ENCOUNTER — Encounter: Payer: Self-pay | Admitting: Internal Medicine

## 2021-04-08 ENCOUNTER — Ambulatory Visit (INDEPENDENT_AMBULATORY_CARE_PROVIDER_SITE_OTHER): Payer: Self-pay | Admitting: Internal Medicine

## 2021-04-08 VITALS — BP 118/80 | HR 87 | Temp 97.8°F | Ht 70.2 in | Wt 228.0 lb

## 2021-04-08 DIAGNOSIS — R768 Other specified abnormal immunological findings in serum: Secondary | ICD-10-CM

## 2021-04-08 DIAGNOSIS — K219 Gastro-esophageal reflux disease without esophagitis: Secondary | ICD-10-CM

## 2021-04-08 DIAGNOSIS — G43909 Migraine, unspecified, not intractable, without status migrainosus: Secondary | ICD-10-CM

## 2021-04-08 DIAGNOSIS — R770 Abnormality of albumin: Secondary | ICD-10-CM

## 2021-04-08 DIAGNOSIS — Z0000 Encounter for general adult medical examination without abnormal findings: Secondary | ICD-10-CM

## 2021-04-08 DIAGNOSIS — Z1159 Encounter for screening for other viral diseases: Secondary | ICD-10-CM

## 2021-04-08 MED ORDER — PROPRANOLOL HCL ER 120 MG PO CP24: 120.0000 mg | ORAL_CAPSULE | Freq: Every day | ORAL | 3 refills | Status: DC

## 2021-04-08 MED ORDER — PROMETHAZINE HCL 25 MG PO TABS: 25.0000 mg | ORAL_TABLET | Freq: Two times a day (BID) | ORAL | 11 refills | Status: DC | PRN

## 2021-04-08 MED ORDER — SUMATRIPTAN SUCCINATE 50 MG PO TABS: ORAL_TABLET | ORAL | 11 refills | Status: DC

## 2021-04-08 MED ORDER — ESOMEPRAZOLE MAGNESIUM 20 MG PO CPDR: 20.0000 mg | DELAYED_RELEASE_CAPSULE | Freq: Every day | ORAL | 3 refills | Status: DC

## 2021-04-08 NOTE — Progress Notes (Signed)
Chief Complaint  ?Patient presents with  ? Annual Exam  ? ?Annual doing well overall ?Anxiety and depression feeling overwhelmed m gf died w/in the last year and doing his taxes/estate issues with him mom this is stressor. He has psychiatry who is managing meds and prn hydroxyzine is helping  ? ? ?Review of Systems  ?Constitutional:  Negative for weight loss.  ?HENT:  Negative for hearing loss.   ?Eyes:  Negative for blurred vision.  ?Respiratory:  Negative for shortness of breath.   ?Cardiovascular:  Negative for chest pain.  ?Gastrointestinal:  Negative for abdominal pain and blood in stool.  ?Genitourinary:  Negative for dysuria.  ?Musculoskeletal:  Negative for back pain, falls and joint pain.  ?Skin:  Negative for rash.  ?Neurological:  Negative for headaches.  ?Psychiatric/Behavioral:  Negative for depression.   ?Past Medical History:  ?Diagnosis Date  ? Anxiety   ? COVID-19   ? 06/2020  ? GERD (gastroesophageal reflux disease)   ? Hyperhidrosis   ? Migraine   ? ?Past Surgical History:  ?Procedure Laterality Date  ? TONSILLECTOMY    ? 1990  ? ?Family History  ?Problem Relation Age of Onset  ? Iron deficiency Father   ? Stroke Maternal Grandfather   ? ?Social History  ? ?Socioeconomic History  ? Marital status: Single  ?  Spouse name: Not on file  ? Number of children: Not on file  ? Years of education: Not on file  ? Highest education level: Not on file  ?Occupational History  ? Not on file  ?Tobacco Use  ? Smoking status: Never  ? Smokeless tobacco: Never  ?Substance and Sexual Activity  ? Alcohol use: Yes  ?  Comment: occas  ? Drug use: No  ? Sexual activity: Never  ?Other Topics Concern  ? Not on file  ?Social History Narrative  ? Undergraduate degree   ? 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 Has a farm   ? No guns owned   ? Wears seatbelt   ? ?Social Determinants of Health  ? ?Financial Resource Strain: Not on file  ?Food Insecurity: Not on file  ?Transportation Needs: Not on file  ?Physical Activity: Not  on file  ?Stress: Not on file  ?Social Connections: Not on file  ?Intimate Partner Violence: Not on file  ? ?Current Meds  ?Medication Sig  ? buPROPion (WELLBUTRIN XL) 150 MG 24 hr tablet Take 150 mg by mouth daily.  ? Cholecalciferol 1.25 MG (50000 UT) capsule Take 1 capsule (50,000 Units total) by mouth once a week. D3  ? FLUoxetine (PROZAC) 40 MG capsule Take 40 mg by mouth daily.  ? hydrOXYzine (ATARAX) 10 MG tablet SMARTSIG:0-3 Tablet(s) By Mouth Daily PRN  ? [DISCONTINUED] esomeprazole (NEXIUM) 20 MG capsule Take 1 capsule (20 mg total) by mouth daily at 12 noon.  ? [DISCONTINUED] FLUoxetine (PROZAC) 20 MG tablet Take 20 mg by mouth daily.  ? [DISCONTINUED] propranolol ER (INDERAL LA) 120 MG 24 hr capsule TAKE ONE CAPSULE BY MOUTH ONE TIME DAILY  ? [DISCONTINUED] SUMAtriptan (IMITREX) 50 MG tablet TAKE 1 TABLET BY MOUTH AS NEEDED FOR MIGRAINE, MAY REPEAT IN 2 HOURS IF NEEDED. MAX DOSE 4 TABLETS IN 1 DAY. DO NOT TAKE MORE THAN 2 TIMES PER WEEK  ? ?Allergies  ?Allergen Reactions  ? Sulfa Antibiotics Anaphylaxis and Shortness Of Breath  ? ?Recent Results (from the past 2160 hour(s))  ?CBC with Differential/Platelet     Status: None  ? Collection Time:  03/29/21 11:53 AM  ?Result Value Ref Range  ? WBC 5.9 3.4 - 10.8 x10E3/uL  ? RBC 4.39 4.14 - 5.80 x10E6/uL  ? Hemoglobin 14.0 13.0 - 17.7 g/dL  ? Hematocrit 40.5 37.5 - 51.0 %  ? MCV 92 79 - 97 fL  ? MCH 31.9 26.6 - 33.0 pg  ? MCHC 34.6 31.5 - 35.7 g/dL  ? RDW 11.8 11.6 - 15.4 %  ? Platelets 259 150 - 450 x10E3/uL  ? Neutrophils 63 Not Estab. %  ? Lymphs 26 Not Estab. %  ? Monocytes 8 Not Estab. %  ? Eos 2 Not Estab. %  ? Basos 0 Not Estab. %  ? Neutrophils Absolute 3.7 1.4 - 7.0 x10E3/uL  ? Lymphocytes Absolute 1.5 0.7 - 3.1 x10E3/uL  ? Monocytes Absolute 0.4 0.1 - 0.9 x10E3/uL  ? EOS (ABSOLUTE) 0.1 0.0 - 0.4 x10E3/uL  ? Basophils Absolute 0.0 0.0 - 0.2 x10E3/uL  ? Immature Granulocytes 1 Not Estab. %  ? Immature Grans (Abs) 0.0 0.0 - 0.1 x10E3/uL  ?Comprehensive  metabolic panel     Status: Abnormal  ? Collection Time: 03/29/21 11:53 AM  ?Result Value Ref Range  ? Glucose 88 70 - 99 mg/dL  ? BUN 10 6 - 20 mg/dL  ? Creatinine, Ser 0.93 0.76 - 1.27 mg/dL  ? eGFR 108 >59 mL/min/1.73  ? BUN/Creatinine Ratio 11 9 - 20  ? Sodium 137 134 - 144 mmol/L  ? Potassium 4.2 3.5 - 5.2 mmol/L  ? Chloride 97 96 - 106 mmol/L  ? CO2 22 20 - 29 mmol/L  ? Calcium 9.4 8.7 - 10.2 mg/dL  ? Total Protein 6.9 6.0 - 8.5 g/dL  ? Albumin 5.0 4.0 - 5.0 g/dL  ? Globulin, Total 1.9 1.5 - 4.5 g/dL  ? Albumin/Globulin Ratio 2.6 (H) 1.2 - 2.2  ? Bilirubin Total 0.4 0.0 - 1.2 mg/dL  ? Alkaline Phosphatase 83 44 - 121 IU/L  ? AST 25 0 - 40 IU/L  ? ALT 34 0 - 44 IU/L  ?Urinalysis, Routine w reflex microscopic     Status: Abnormal  ? Collection Time: 03/29/21 11:53 AM  ?Result Value Ref Range  ? Specific Gravity, UA      <=1.005 (A) 1.005 - 1.030  ? pH, UA 7.0 5.0 - 7.5  ? Color, UA Yellow Yellow  ? Appearance Ur Clear Clear  ? Leukocytes,UA Negative Negative  ? Protein,UA Negative Negative/Trace  ? Glucose, UA Negative Negative  ? Ketones, UA Negative Negative  ? RBC, UA Negative Negative  ? Bilirubin, UA Negative Negative  ? Urobilinogen, Ur 0.2 0.2 - 1.0 mg/dL  ? Nitrite, UA Negative Negative  ? Microscopic Examination Comment   ?  Comment: Microscopic not indicated and not performed.  ?HCV Ab w Reflex to Quant PCR     Status: Abnormal  ? Collection Time: 03/29/21 11:53 AM  ?Result Value Ref Range  ? HCV Ab Reactive (A) Non Reactive  ?HCV RT-PCR, Quant (Non-Graph)     Status: None  ? Collection Time: 03/29/21 11:53 AM  ?Result Value Ref Range  ? Hepatitis C Quantitation HCV Not Detected IU/mL  ? Test Information (HCV): Comment   ?  Comment: The quantitative range of this assay is 15 IU/mL to 100 million IU/mL.  ? Interpretation (HCV): Comment   ?  Comment: Positive HCV antibody screen without the presence of HCV RNA ?is consistent with a resolved past infection or a false ?positive HCV antibody. Consider  repeat testing  after one ?month. ?  ?TSH     Status: None  ? Collection Time: 03/29/21 11:53 AM  ?Result Value Ref Range  ? TSH 1.730 0.450 - 4.500 uIU/mL  ?VITAMIN D 25 Hydroxy (Vit-D Deficiency, Fractures)     Status: Abnormal  ? Collection Time: 03/29/21 11:53 AM  ?Result Value Ref Range  ? Vit D, 25-Hydroxy 17.9 (L) 30.0 - 100.0 ng/mL  ?  Comment: Vitamin D deficiency has been defined by the Institute of ?Medicine and an Endocrine Society practice guideline as a ?level of serum 25-OH vitamin D less than 20 ng/mL (1,2). ?The Endocrine Society went on to further define vitamin D ?insufficiency as a level between 21 and 29 ng/mL (2). ?1. IOM (Institute of Medicine). 2010. Dietary reference ?   intakes for calcium and D. [place]: The ?   [employer]. ?2. Holick MF, Binkley [place], Bischoff-Ferrari HA, et al. ?   Evaluation, treatment, and prevention of vitamin D ?   deficiency: an Endocrine Society clinical practice ?   guideline. JCEM. 2011 Jul; 96(7):1911-30. ?  ?HIV Antibody (routine testing w rflx)     Status: None  ? Collection Time: 03/29/21 11:53 AM  ?Result Value Ref Range  ? HIV Screen 4th Generation wRfx Non Reactive Non Reactive  ?  Comment: HIV Negative ?HIV-1/HIV-2 antibodies and HIV-1 p24 antigen were NOT detected. ?There is no laboratory evidence of HIV infection. ?  ? ?Objective  ?Body mass index is 32.53 kg/m?. ?Wt Readings from Last 3 Encounters:  ?04/08/21 228 lb (103.4 kg)  ?04/07/20 215 lb 3.2 oz (97.6 kg)  ?12/18/18 214 lb (97.1 kg)  ? ?Temp Readings from Last 3 Encounters:  ?04/08/21 97.8 ?F (36.6 ?C) (Oral)  ?04/07/20 98 ?F (36.7 ?C) (Oral)  ?12/08/17 98.7 ?F (37.1 ?C) (Oral)  ? ?BP Readings from Last 3 Encounters:  ?04/08/21 118/80  ?04/07/20 122/86  ?12/08/17 128/80  ? ?Pulse Readings from Last 3 Encounters:  ?04/08/21 87  ?04/07/20 72  ?12/08/17 94  ? ? ?Physical Exam ?Vitals and nursing note reviewed.  ?Constitutional:   ?   Appearance: Normal appearance. He is  well-developed and well-groomed.  ?HENT:  ?   Head: Normocephalic and atraumatic.  ?Eyes:  ?   Conjunctiva/sclera: Conjunctivae normal.  ?   Pupils: Pupils are equal, round, and reactive to light.  ?Cardiovascular:  ?

## 2021-05-11 ENCOUNTER — Other Ambulatory Visit: Payer: Self-pay | Admitting: Internal Medicine

## 2021-05-11 DIAGNOSIS — G43909 Migraine, unspecified, not intractable, without status migrainosus: Secondary | ICD-10-CM

## 2021-05-27 ENCOUNTER — Other Ambulatory Visit: Payer: Self-pay | Admitting: Internal Medicine

## 2021-05-27 DIAGNOSIS — G43909 Migraine, unspecified, not intractable, without status migrainosus: Secondary | ICD-10-CM

## 2021-06-30 ENCOUNTER — Other Ambulatory Visit: Payer: Self-pay | Admitting: Internal Medicine

## 2021-06-30 DIAGNOSIS — G43909 Migraine, unspecified, not intractable, without status migrainosus: Secondary | ICD-10-CM

## 2021-09-07 ENCOUNTER — Other Ambulatory Visit: Payer: Self-pay | Admitting: Internal Medicine

## 2021-09-21 ENCOUNTER — Other Ambulatory Visit: Payer: Self-pay | Admitting: Internal Medicine

## 2021-09-21 DIAGNOSIS — G43909 Migraine, unspecified, not intractable, without status migrainosus: Secondary | ICD-10-CM

## 2021-10-04 ENCOUNTER — Other Ambulatory Visit: Payer: Self-pay | Admitting: Internal Medicine

## 2021-10-04 DIAGNOSIS — G43909 Migraine, unspecified, not intractable, without status migrainosus: Secondary | ICD-10-CM

## 2021-10-04 DIAGNOSIS — K219 Gastro-esophageal reflux disease without esophagitis: Secondary | ICD-10-CM

## 2021-10-04 MED ORDER — BUPROPION HCL ER (XL) 150 MG PO TB24: 150.0000 mg | ORAL_TABLET | Freq: Every day | ORAL | 3 refills | Status: DC

## 2021-10-04 MED ORDER — PROMETHAZINE HCL 25 MG PO TABS: 25.0000 mg | ORAL_TABLET | Freq: Two times a day (BID) | ORAL | 11 refills | Status: DC | PRN

## 2021-10-04 MED ORDER — CHOLECALCIFEROL 1.25 MG (50000 UT) PO CAPS: 50000.0000 [IU] | ORAL_CAPSULE | ORAL | 1 refills | Status: DC

## 2021-10-04 MED ORDER — PROPRANOLOL HCL ER 120 MG PO CP24: 120.0000 mg | ORAL_CAPSULE | Freq: Every day | ORAL | 3 refills | Status: DC

## 2021-10-04 MED ORDER — FLUOXETINE HCL 40 MG PO CAPS: 40.0000 mg | ORAL_CAPSULE | Freq: Every day | ORAL | 3 refills | Status: DC

## 2021-10-04 MED ORDER — ESOMEPRAZOLE MAGNESIUM 20 MG PO CPDR: 20.0000 mg | DELAYED_RELEASE_CAPSULE | Freq: Every day | ORAL | 3 refills | Status: DC

## 2022-02-23 LAB — COMPREHENSIVE METABOLIC PANEL
Calcium: 9.5 (ref 8.7–10.7)
eGFR: 84

## 2022-02-23 LAB — HEPATIC FUNCTION PANEL
ALT: 40 U/L (ref 10–40)
AST: 26 (ref 14–40)
Alkaline Phosphatase: 77 (ref 25–125)
Bilirubin, Total: 0.5

## 2022-02-23 LAB — BASIC METABOLIC PANEL
BUN: 17 (ref 4–21)
CO2: 29 — AB (ref 13–22)
Chloride: 99 (ref 99–108)
Creatinine: 1.1 (ref 0.6–1.3)
Glucose: 98
Potassium: 4.2 mEq/L (ref 3.5–5.1)
Sodium: 136 — AB (ref 137–147)

## 2022-02-23 LAB — LIPID PANEL
Cholesterol: 215 — AB (ref 0–200)
HDL: 57 (ref 35–70)
LDL Cholesterol: 138
Triglycerides: 102 (ref 40–160)

## 2022-02-23 LAB — HEMOGLOBIN A1C: Hemoglobin A1C: 5

## 2022-02-23 LAB — TSH: TSH: 2.28 (ref 0.41–5.90)

## 2022-02-28 ENCOUNTER — Telehealth: Payer: Self-pay

## 2022-02-28 NOTE — Telephone Encounter (Signed)
We received a prescription authorization request via fax today from [facility] for patient.  I called patient to let him know we received this fax, but we cannot process it until he has a TOC appointment scheduled with one of our available providers.  I let patient know which providers are accepting new patients.  Patient states he will research the providers online and call us back to let us know which one he would like to transfer his care to.

## 2022-04-12 ENCOUNTER — Encounter: Payer: Self-pay | Admitting: Internal Medicine

## 2022-06-07 ENCOUNTER — Encounter: Payer: Self-pay | Admitting: Internal Medicine

## 2022-06-07 ENCOUNTER — Other Ambulatory Visit: Payer: Self-pay | Admitting: Family

## 2022-06-07 ENCOUNTER — Ambulatory Visit (INDEPENDENT_AMBULATORY_CARE_PROVIDER_SITE_OTHER): Payer: Self-pay | Admitting: Internal Medicine

## 2022-06-07 VITALS — BP 104/70 | HR 102 | Ht 70.2 in | Wt 200.0 lb

## 2022-06-07 DIAGNOSIS — G43909 Migraine, unspecified, not intractable, without status migrainosus: Secondary | ICD-10-CM

## 2022-06-07 DIAGNOSIS — Z79899 Other long term (current) drug therapy: Secondary | ICD-10-CM

## 2022-06-07 DIAGNOSIS — E559 Vitamin D deficiency, unspecified: Secondary | ICD-10-CM

## 2022-06-07 DIAGNOSIS — R768 Other specified abnormal immunological findings in serum: Secondary | ICD-10-CM

## 2022-06-07 DIAGNOSIS — F419 Anxiety disorder, unspecified: Secondary | ICD-10-CM

## 2022-06-07 DIAGNOSIS — K219 Gastro-esophageal reflux disease without esophagitis: Secondary | ICD-10-CM

## 2022-06-07 HISTORY — DX: Other long term (current) drug therapy: Z79.899

## 2022-06-07 MED ORDER — SUMATRIPTAN SUCCINATE 50 MG PO TABS
ORAL_TABLET | ORAL | 11 refills | Status: DC
Start: 2022-06-07 — End: 2023-06-21

## 2022-06-07 MED ORDER — ESOMEPRAZOLE MAGNESIUM 40 MG PO CPDR: 40.0000 mg | DELAYED_RELEASE_CAPSULE | Freq: Every day | ORAL | 1 refills | Status: DC

## 2022-06-07 MED ORDER — CHOLECALCIFEROL 1.25 MG (50000 UT) PO CAPS: 50000.0000 [IU] | ORAL_CAPSULE | ORAL | 1 refills | Status: DC

## 2022-06-07 NOTE — Progress Notes (Signed)
Date:  06/07/2022   Name:  Kevin Bowers   DOB:  03/13/1985   MRN:  [id number]   Chief Complaint: New Patient (Initial Visit)  Depression        This is a chronic (on line provider is [facility]) problem.The problem is unchanged.  Associated symptoms include headaches.  Associated symptoms include no fatigue.  Past treatments include SSRIs - Selective serotonin reuptake inhibitors and other medications. Migraine  This is a recurrent (about once a week) problem. The problem has been unchanged. The pain is located in the Right unilateral region. The pain quality is similar to prior headaches. Associated symptoms include abdominal pain, nausea and photophobia. Pertinent negatives include no dizziness, fever or vomiting. Nothing aggravates the symptoms. He has tried triptans for the symptoms. The treatment provided significant relief.  Gastroesophageal Reflux He complains of abdominal pain, heartburn and nausea. He reports no chest pain. This is a recurrent problem. The problem occurs rarely. Pertinent negatives include no fatigue. He has tried a PPI for the symptoms. The treatment provided significant relief. Past procedures include an EGD.    Lab Results  Component Value Date   NA 136 (A) 02/23/2022   K 4.2 02/23/2022   CO2 29 (A) 02/23/2022   GLUCOSE 88 03/29/2021   BUN 17 02/23/2022   CREATININE 1.1 02/23/2022   CALCIUM 9.5 02/23/2022   EGFR 84 02/23/2022   Lab Results  Component Value Date   CHOL 215 (A) 02/23/2022   HDL 57 02/23/2022   LDLCALC 138 02/23/2022   TRIG 102 02/23/2022   Lab Results  Component Value Date   TSH 2.28 02/23/2022   Lab Results  Component Value Date   HGBA1C 5 02/23/2022   Lab Results  Component Value Date   WBC 5.9 03/29/2021   HGB 14.0 03/29/2021   HCT 40.5 03/29/2021   MCV 92 03/29/2021   PLT 259 03/29/2021   Lab Results  Component Value Date   ALT 40 02/23/2022   AST 26 02/23/2022   ALKPHOS 77 02/23/2022   BILITOT 0.4  03/29/2021   Lab Results  Component Value Date   VD25OH 17.9 (L) 03/29/2021     Review of Systems  Constitutional:  Negative for chills, fatigue, fever and unexpected weight change.  HENT:  Negative for trouble swallowing.   Eyes:  Positive for photophobia.  Respiratory:  Negative for chest tightness and shortness of breath.   Cardiovascular:  Negative for chest pain and leg swelling.  Gastrointestinal:  Positive for abdominal pain, heartburn and nausea. Negative for blood in stool and vomiting.  Musculoskeletal:  Negative for arthralgias.  Neurological:  Positive for headaches. Negative for dizziness and light-headedness.  Psychiatric/Behavioral:  Positive for depression and sleep disturbance. Negative for dysphoric mood. The patient is nervous/anxious.     Patient Active Problem List   Diagnosis Date Noted   On pre-exposure prophylaxis for HIV 06/07/2022   Vitamin D deficiency 03/30/2021   Eustachian tube disorder, bilateral 04/08/2020   Migraine without status migrainosus, not intractable 03/29/2017   Anxiety 03/29/2017   Gastroesophageal reflux disease 03/29/2017    Allergies  Allergen Reactions   Sulfa Antibiotics Anaphylaxis and Shortness Of Breath    Past Surgical History:  Procedure Laterality Date   TONSILLECTOMY     1990    Social History   Tobacco Use   Smoking status: Never   Smokeless tobacco: Never  Substance Use Topics   Alcohol use: Yes    Alcohol/week: 4.0 standard drinks  of alcohol    Types: 4 Glasses of wine per week    Comment: occas   Drug use: No     Medication list has been reviewed and updated.  Current Meds  Medication Sig   buPROPion (WELLBUTRIN XL) 150 MG 24 hr tablet Take 1 tablet (150 mg total) by mouth daily.   DESCOVY 200-25 MG tablet Take 1 tablet by mouth daily.   FLUoxetine (PROZAC) 40 MG capsule Take 1 capsule (40 mg total) by mouth daily. (Patient taking differently: Take 30 mg by mouth daily.)   hydrOXYzine (ATARAX) 10  MG tablet SMARTSIG:0-3 Tablet(s) By Mouth Daily PRN   promethazine (PHENERGAN) 25 MG tablet Take 1 tablet (25 mg total) by mouth 2 (two) times daily as needed for nausea or vomiting (migraine).   [DISCONTINUED] Cholecalciferol 1.25 MG (50000 UT) capsule Take 1 capsule (50,000 Units total) by mouth once a week. D3   [DISCONTINUED] esomeprazole (NEXIUM) 20 MG capsule Take 1 capsule (20 mg total) by mouth daily at 12 noon. (Patient taking differently: Take 40 mg by mouth daily at 12 noon.)   [DISCONTINUED] esomeprazole (NEXIUM) 40 MG capsule Take 40 mg by mouth daily at 12 noon.   [DISCONTINUED] SUMAtriptan (IMITREX) 50 MG tablet TAKE 1 TABLET BY MOUTH AS NEEDED FOR MIGRAINE, MAY REPEAT IN 2 HOURS IF NEEDED (MAX DOSE 4 TABLETS IN 1 DAY, DO NOT TAKE MORE THAN 2 TIMES)       06/07/2022    2:30 PM  GAD 7 : Generalized Anxiety Score  Nervous, Anxious, on Edge 2  Control/stop worrying 0  Worry too much - different things 3  Trouble relaxing 0  Restless 0  Easily annoyed or irritable 2  Afraid - awful might happen 0  Total GAD 7 Score 7  Anxiety Difficulty Not difficult at all       06/07/2022    2:29 PM 04/07/2021    4:00 PM 04/07/2020    1:09 PM  Depression screen PHQ 2/9  Decreased Interest 2 0 0  Down, Depressed, Hopeless 0 1 0  PHQ - 2 Score 2 1 0  Altered sleeping 1    Tired, decreased energy 1    Feeling bad or failure about yourself  0    Trouble concentrating 2    Moving slowly or fidgety/restless 0    Suicidal thoughts 0    PHQ-9 Score 6    Difficult doing work/chores Not difficult at all      BP Readings from Last 3 Encounters:  06/07/22 104/70  04/08/21 118/80  04/07/20 122/86    Physical Exam Vitals and nursing note reviewed.  Constitutional:      General: He is not in acute distress.    Appearance: Normal appearance. He is well-developed.  HENT:     Head: Normocephalic and atraumatic.  Neck:     Vascular: No carotid bruit.  Cardiovascular:     Rate and  Rhythm: Normal rate and regular rhythm.     Heart sounds: No murmur heard. Pulmonary:     Effort: Pulmonary effort is normal. No respiratory distress.     Breath sounds: No wheezing or rhonchi.  Musculoskeletal:     Cervical back: Normal range of motion.     Right lower leg: No edema.     Left lower leg: No edema.  Lymphadenopathy:     Cervical: No cervical adenopathy.  Skin:    General: Skin is warm and dry.     Findings: No rash.  Neurological:     General: No focal deficit present.     Mental Status: He is alert and oriented to person, place, and time.  Psychiatric:        Mood and Affect: Mood normal.        Behavior: Behavior normal.     Wt Readings from Last 3 Encounters:  06/07/22 200 lb (90.7 kg)  04/08/21 228 lb (103.4 kg)  04/07/20 215 lb 3.2 oz (97.6 kg)    BP 104/70   Pulse (!) 102   Ht 5' 10.2" (1.783 m)   Wt 200 lb (90.7 kg)   SpO2 99%   BMI 28.53 kg/m   Assessment and Plan:  Problem List Items Addressed This Visit     Migraine without status migrainosus, not intractable - Primary (Chronic)    No recent change in the character or frequency of headaches. Headaches respond well to current therapy with imitrex. Will continue current plan and follow up if worsening. Has tried Inderal for prevention      Relevant Medications   SUMAtriptan (IMITREX) 50 MG tablet   Anxiety (Chronic)    Treated by on line provider [facility] Prozac 30 mg and Bupropion 150 mg for past 2 years      Gastroesophageal reflux disease (Chronic)    Reflux symptoms are minimal on current therapy - nexium 40 mg daily. No red flag signs such as weight loss, n/v, melena       Relevant Medications   esomeprazole (NEXIUM) 40 MG capsule   Vitamin D deficiency (Chronic)   Relevant Medications   Cholecalciferol 1.25 MG (50000 UT) capsule   Other Relevant Orders   VITAMIN D 25 Hydroxy (Vit-D Deficiency, Fractures)   On pre-exposure prophylaxis for HIV    Not currently  sexually active so not taking Descovy at this time He continues to do periodic HIV testing from on-line prescriber      Other Visit Diagnoses     Hepatitis C antibody test positive       Neg in 2017; + 2023 with neg HCV quant. will repeat today to verify   Relevant Orders   HCV Ab w Reflex to Quant PCR     He will send his Tdap vaccination dates via MyChart.  Return in about 6 months (around 12/08/2022) for Depression.   Partially dictated using Dragon software, any errors are not intentional.  Reubin Milan, MD [facility] [place], [place]

## 2022-06-07 NOTE — Assessment & Plan Note (Signed)
Not currently sexually active so not taking Descovy at this time He continues to do periodic HIV testing from on-line prescriber

## 2022-06-07 NOTE — Assessment & Plan Note (Addendum)
No recent change in the character or frequency of headaches. Headaches respond well to current therapy with imitrex. Will continue current plan and follow up if worsening. Has tried Inderal for prevention

## 2022-06-07 NOTE — Assessment & Plan Note (Signed)
Treated by on line provider [facility] Prozac 30 mg and Bupropion 150 mg for past 2 years

## 2022-06-07 NOTE — Assessment & Plan Note (Signed)
Reflux symptoms are minimal on current therapy - nexium 40 mg daily. No red flag signs such as weight loss, n/v, melena

## 2022-06-07 NOTE — Telephone Encounter (Signed)
We received another prescription authorization request from [facility] for patient.  I left a voicemail for patient letting him know that we received the request and we would need to have a TOC appointment scheduled for him  with one of our providers before we would be able to take care of this request. I asked patient to please call us back and let us know if he is planning to stay with [facility] or if he is planning to go elsewhere for his primary care.  If he is staying with [facility], then we will be able to schedule a TOC appointment for him.

## 2022-06-08 LAB — VITAMIN D 25 HYDROXY (VIT D DEFICIENCY, FRACTURES): Vit D, 25-Hydroxy: 68.4 ng/mL (ref 30.0–100.0)

## 2022-06-08 LAB — HCV INTERPRETATION

## 2022-06-08 LAB — HCV AB W REFLEX TO QUANT PCR: HCV Ab: NONREACTIVE

## 2022-12-20 ENCOUNTER — Ambulatory Visit (INDEPENDENT_AMBULATORY_CARE_PROVIDER_SITE_OTHER): Payer: Self-pay | Admitting: Internal Medicine

## 2022-12-20 ENCOUNTER — Encounter: Payer: Self-pay | Admitting: Internal Medicine

## 2022-12-20 VITALS — BP 104/70 | HR 96 | Ht 70.2 in | Wt 207.0 lb

## 2022-12-20 DIAGNOSIS — E559 Vitamin D deficiency, unspecified: Secondary | ICD-10-CM

## 2022-12-20 DIAGNOSIS — F419 Anxiety disorder, unspecified: Secondary | ICD-10-CM

## 2022-12-20 DIAGNOSIS — K219 Gastro-esophageal reflux disease without esophagitis: Secondary | ICD-10-CM

## 2022-12-20 DIAGNOSIS — Z23 Encounter for immunization: Secondary | ICD-10-CM

## 2022-12-20 DIAGNOSIS — G43009 Migraine without aura, not intractable, without status migrainosus: Secondary | ICD-10-CM

## 2022-12-20 MED ORDER — CHOLECALCIFEROL 1.25 MG (50000 UT) PO CAPS: 50000.0000 [IU] | ORAL_CAPSULE | ORAL | 1 refills | Status: DC

## 2022-12-20 MED ORDER — ESOMEPRAZOLE MAGNESIUM 40 MG PO CPDR: 40.0000 mg | DELAYED_RELEASE_CAPSULE | Freq: Every day | ORAL | 1 refills | Status: DC

## 2022-12-20 NOTE — Progress Notes (Signed)
Date:  12/20/2022   Name:  Kevin Bowers   DOB:  1985-08-15   MRN:  [id number]   Chief Complaint: Depression  Migraine  This is a chronic problem. Episode frequency: 4 times per month. The pain quality is similar to prior headaches. Pertinent negatives include no abdominal pain, coughing, dizziness or weakness. He has tried triptans for the symptoms. The treatment provided significant relief.  Depression        This is a chronic problem.The problem is unchanged.  Associated symptoms include no fatigue and no headaches.  Past treatments include SSRIs - Selective serotonin reuptake inhibitors (prozac and bupropion).  Compliance with treatment is good.   Review of Systems  Constitutional:  Negative for fatigue and unexpected weight change.  HENT:  Negative for nosebleeds.   Eyes:  Negative for visual disturbance.  Respiratory:  Negative for cough, chest tightness, shortness of breath and wheezing.   Cardiovascular:  Negative for chest pain, palpitations and leg swelling.  Gastrointestinal:  Negative for abdominal pain, constipation and diarrhea.  Musculoskeletal:  Negative for arthralgias.  Neurological:  Negative for dizziness, weakness, light-headedness and headaches.  Psychiatric/Behavioral:  Positive for depression and dysphoric mood. Negative for sleep disturbance. The patient is nervous/anxious.      Lab Results  Component Value Date   NA 136 (A) 02/23/2022   K 4.2 02/23/2022   CO2 29 (A) 02/23/2022   GLUCOSE 88 03/29/2021   BUN 17 02/23/2022   CREATININE 1.1 02/23/2022   CALCIUM 9.5 02/23/2022   EGFR 84 02/23/2022   Lab Results  Component Value Date   CHOL 215 (A) 02/23/2022   HDL 57 02/23/2022   LDLCALC 138 02/23/2022   TRIG 102 02/23/2022   Lab Results  Component Value Date   TSH 2.28 02/23/2022   Lab Results  Component Value Date   HGBA1C 5 02/23/2022   Lab Results  Component Value Date   WBC 5.9 03/29/2021   HGB 14.0 03/29/2021   HCT 40.5 03/29/2021    MCV 92 03/29/2021   PLT 259 03/29/2021   Lab Results  Component Value Date   ALT 40 02/23/2022   AST 26 02/23/2022   ALKPHOS 77 02/23/2022   BILITOT 0.4 03/29/2021   Lab Results  Component Value Date   VD25OH 68.4 06/07/2022     Patient Active Problem List   Diagnosis Date Noted   On pre-exposure prophylaxis for HIV 06/07/2022   Vitamin D deficiency 03/30/2021   Eustachian tube disorder, bilateral 04/08/2020   Migraine without status migrainosus, not intractable 03/29/2017   Anxiety 03/29/2017   Gastroesophageal reflux disease 03/29/2017    Allergies  Allergen Reactions   Sulfa Antibiotics Anaphylaxis and Shortness Of Breath    Past Surgical History:  Procedure Laterality Date   TONSILLECTOMY     1990    Social History   Tobacco Use   Smoking status: Never   Smokeless tobacco: Never  Substance Use Topics   Alcohol use: Yes    Alcohol/week: 4.0 standard drinks of alcohol    Types: 4 Glasses of wine per week    Comment: occas   Drug use: No     Medication list has been reviewed and updated.  Current Meds  Medication Sig   buPROPion (WELLBUTRIN XL) 300 MG 24 hr tablet Take 300 mg by mouth daily.   DESCOVY 200-25 MG tablet Take 1 tablet by mouth daily.   FLUoxetine (PROZAC) 40 MG capsule Take 40 mg by mouth daily.  SUMAtriptan (IMITREX) 50 MG tablet TAKE 1 TABLET BY MOUTH AS NEEDED FOR MIGRAINE, MAY REPEAT IN 2 HOURS IF NEEDED (MAX DOSE 4 TABLETS IN 1 DAY, DO NOT TAKE MORE THAN 2 TIMES)   [DISCONTINUED] buPROPion (WELLBUTRIN XL) 150 MG 24 hr tablet Take 1 tablet (150 mg total) by mouth daily. (Patient taking differently: Take 300 mg by mouth daily.)   [DISCONTINUED] Cholecalciferol 1.25 MG (50000 UT) capsule Take 1 capsule (50,000 Units total) by mouth once a week. D3   [DISCONTINUED] esomeprazole (NEXIUM) 40 MG capsule Take 1 capsule (40 mg total) by mouth daily at 12 noon.   [DISCONTINUED] FLUoxetine (PROZAC) 40 MG capsule Take 1 capsule (40 mg total) by  mouth daily.   [DISCONTINUED] hydrOXYzine (ATARAX) 10 MG tablet SMARTSIG:0-3 Tablet(s) By Mouth Daily PRN       12/20/2022    2:22 PM 06/07/2022    2:30 PM  GAD 7 : Generalized Anxiety Score  Nervous, Anxious, on Edge 2 2  Control/stop worrying 0 0  Worry too much - different things 2 3  Trouble relaxing 1 0  Restless 0 0  Easily annoyed or irritable 3 2  Afraid - awful might happen 0 0  Total GAD 7 Score 8 7  Anxiety Difficulty Somewhat difficult Not difficult at all       12/20/2022    2:22 PM 06/07/2022    2:29 PM 04/07/2021    4:00 PM  Depression screen PHQ 2/9  Decreased Interest 3 2 0  Down, Depressed, Hopeless 1 0 1  PHQ - 2 Score 4 2 1   Altered sleeping 0 1   Tired, decreased energy 3 1   Change in appetite 0    Feeling bad or failure about yourself  0 0   Trouble concentrating 3 2   Moving slowly or fidgety/restless 0 0   Suicidal thoughts 0 0   PHQ-9 Score 10 6   Difficult doing work/chores Very difficult Not difficult at all     BP Readings from Last 3 Encounters:  12/20/22 104/70  06/07/22 104/70  04/08/21 118/80    Physical Exam Vitals and nursing note reviewed.  Constitutional:      General: He is not in acute distress.    Appearance: Normal appearance. He is well-developed.  HENT:     Head: Normocephalic and atraumatic.  Neck:     Vascular: No carotid bruit.  Cardiovascular:     Rate and Rhythm: Normal rate and regular rhythm.     Heart sounds: No murmur heard. Pulmonary:     Effort: Pulmonary effort is normal. No respiratory distress.     Breath sounds: No wheezing or rhonchi.  Musculoskeletal:        General: No swelling.     Cervical back: Normal range of motion.     Right lower leg: No edema.     Left lower leg: No edema.  Lymphadenopathy:     Cervical: No cervical adenopathy.  Skin:    General: Skin is warm and dry.     Findings: No rash.  Neurological:     General: No focal deficit present.     Mental Status: He is alert and  oriented to person, place, and time.  Psychiatric:        Mood and Affect: Mood normal.        Behavior: Behavior normal.     Wt Readings from Last 3 Encounters:  12/20/22 207 lb (93.9 kg)  06/07/22 200 lb (90.7 kg)  04/08/21 228 lb (103.4 kg)    BP 104/70   Pulse 96   Ht 5' 10.2" (1.783 m)   Wt 207 lb (93.9 kg)   SpO2 97%   BMI 29.53 kg/m   Assessment and Plan:  Problem List Items Addressed This Visit       Unprioritized   Anxiety (Chronic)    Clinically stable on Prozac and bupropion, No SI or HI reported. Followed by [facility].  Doses recently adjusted. Doing well without concerns at this time.       Relevant Medications   buPROPion (WELLBUTRIN XL) 300 MG 24 hr tablet   FLUoxetine (PROZAC) 40 MG capsule   Gastroesophageal reflux disease (Chronic)    Reflux symptoms are minimal on current therapy - Nexium. No red flag signs such as weight loss, n/v, melena       Relevant Medications   esomeprazole (NEXIUM) 40 MG capsule   Migraine without status migrainosus, not intractable - Primary (Chronic)    No recent change in headaches. Headaches respond well to current therapy with Imitrex prn. Will continue regimen and follow up if worsening.       Relevant Medications   buPROPion (WELLBUTRIN XL) 300 MG 24 hr tablet   FLUoxetine (PROZAC) 40 MG capsule   Vitamin D deficiency (Chronic)   Relevant Medications   Cholecalciferol 1.25 MG (50000 UT) capsule   Other Visit Diagnoses     Need for diphtheria-tetanus-pertussis (Tdap) vaccine       Relevant Orders   Tdap vaccine greater than or equal to 7yo IM (Completed)   Need for influenza vaccination       Relevant Orders   Flu vaccine trivalent PF, 6mos and older(Flulaval,Afluria,Fluarix,Fluzone) (Completed)       Return in about 6 months (around 06/20/2023) for CPX.    Reubin Milan, MD [facility] [place]

## 2022-12-20 NOTE — Assessment & Plan Note (Addendum)
No recent change in headaches. Headaches respond well to current therapy with Imitrex prn. Will continue regimen and follow up if worsening.

## 2022-12-20 NOTE — Assessment & Plan Note (Addendum)
Clinically stable on Prozac and bupropion, No SI or HI reported. Followed by [facility].  Doses recently adjusted. Doing well without concerns at this time.

## 2022-12-20 NOTE — Assessment & Plan Note (Signed)
Reflux symptoms are minimal on current therapy - Nexium. No red flag signs such as weight loss, n/v, melena

## 2023-06-21 ENCOUNTER — Ambulatory Visit (INDEPENDENT_AMBULATORY_CARE_PROVIDER_SITE_OTHER): Payer: Self-pay | Admitting: Internal Medicine

## 2023-06-21 ENCOUNTER — Encounter: Payer: Self-pay | Admitting: Internal Medicine

## 2023-06-21 VITALS — BP 124/82 | HR 92 | Ht 70.2 in | Wt 213.1 lb

## 2023-06-21 DIAGNOSIS — E559 Vitamin D deficiency, unspecified: Secondary | ICD-10-CM

## 2023-06-21 DIAGNOSIS — F419 Anxiety disorder, unspecified: Secondary | ICD-10-CM

## 2023-06-21 DIAGNOSIS — K219 Gastro-esophageal reflux disease without esophagitis: Secondary | ICD-10-CM

## 2023-06-21 DIAGNOSIS — G43009 Migraine without aura, not intractable, without status migrainosus: Secondary | ICD-10-CM

## 2023-06-21 DIAGNOSIS — Z0000 Encounter for general adult medical examination without abnormal findings: Secondary | ICD-10-CM

## 2023-06-21 DIAGNOSIS — E785 Hyperlipidemia, unspecified: Secondary | ICD-10-CM

## 2023-06-21 MED ORDER — SUMATRIPTAN SUCCINATE 50 MG PO TABS: ORAL_TABLET | ORAL | 5 refills | Status: AC

## 2023-06-21 MED ORDER — CHOLECALCIFEROL 1.25 MG (50000 UT) PO CAPS: 50000.0000 [IU] | ORAL_CAPSULE | ORAL | 1 refills | Status: AC

## 2023-06-21 MED ORDER — ESOMEPRAZOLE MAGNESIUM 40 MG PO CPDR: 40.0000 mg | DELAYED_RELEASE_CAPSULE | Freq: Every day | ORAL | 1 refills | Status: DC

## 2023-06-21 NOTE — Assessment & Plan Note (Signed)
 Continues on weekly high dose vitamin D .

## 2023-06-21 NOTE — Progress Notes (Signed)
 Date:  06/21/2023   Name:  Kevin Bowers   DOB:  1985-10-06   MRN:  [id number]   Chief Complaint: Annual Exam Kevin Bowers is a 38 y.o. male who presents today for his Complete Annual Exam. He feels well. He reports exercising none. He reports he is sleeping fairly well.   Health Maintenance  Topic Date Due   COVID-19 Vaccine (3 - Pfizer risk series) 07/06/2024*   Flu Shot  08/18/2023   DTaP/Tdap/Td vaccine (2 - Td or Tdap) 12/19/2032   Hepatitis C Screening  Completed   HIV Screening  Completed   HPV Vaccine  Aged Out   Meningitis B Vaccine  Aged Out  *Topic was postponed. The date shown is not the original due date.    No results found for: "PSA1", "PSA"   Depression        This is a chronic (followed by Psych) problem.The problem is unchanged.  Associated symptoms include no fatigue and no headaches.( He reports having a panic attack a few weeks ago which is a new symptom)  Past treatments include SSRIs - Selective serotonin reuptake inhibitors and other medications. Migraine  This is a recurrent problem. The pain quality is similar to prior headaches. Pertinent negatives include no abdominal pain, coughing, dizziness or weakness. He has tried triptans for the symptoms.  Gastroesophageal Reflux He complains of heartburn. He reports no abdominal pain, no chest pain, no coughing or no wheezing. This is a recurrent problem. The problem occurs occasionally. Pertinent negatives include no fatigue. He has tried a PPI for the symptoms.    Review of Systems  Constitutional:  Negative for fatigue and unexpected weight change.  HENT:  Negative for nosebleeds and trouble swallowing.   Eyes:  Negative for visual disturbance.  Respiratory:  Negative for cough, chest tightness, shortness of breath and wheezing.   Cardiovascular:  Negative for chest pain, palpitations and leg swelling.  Gastrointestinal:  Positive for heartburn. Negative for abdominal pain, constipation and diarrhea.   Genitourinary:  Negative for hematuria and urgency.  Musculoskeletal:  Negative for arthralgias and gait problem.  Neurological:  Negative for dizziness, weakness, light-headedness and headaches.  Psychiatric/Behavioral:  Positive for depression. Negative for dysphoric mood and sleep disturbance. The patient is not nervous/anxious.      Lab Results  Component Value Date   NA 136 (A) 02/23/2022   K 4.2 02/23/2022   CO2 29 (A) 02/23/2022   GLUCOSE 88 03/29/2021   BUN 17 02/23/2022   CREATININE 1.1 02/23/2022   CALCIUM 9.5 02/23/2022   EGFR 84 02/23/2022   Lab Results  Component Value Date   CHOL 215 (A) 02/23/2022   HDL 57 02/23/2022   LDLCALC 138 02/23/2022   TRIG 102 02/23/2022   Lab Results  Component Value Date   TSH 2.28 02/23/2022   Lab Results  Component Value Date   HGBA1C 5 02/23/2022   Lab Results  Component Value Date   WBC 5.9 03/29/2021   HGB 14.0 03/29/2021   HCT 40.5 03/29/2021   MCV 92 03/29/2021   PLT 259 03/29/2021   Lab Results  Component Value Date   ALT 40 02/23/2022   AST 26 02/23/2022   ALKPHOS 77 02/23/2022   BILITOT 0.4 03/29/2021   Lab Results  Component Value Date   VD25OH 68.4 06/07/2022     Patient Active Problem List   Diagnosis Date Noted   Vitamin D  deficiency 03/30/2021   Eustachian tube disorder, bilateral 04/08/2020  Migraine without status migrainosus, not intractable 03/29/2017   Anxiety 03/29/2017   Gastroesophageal reflux disease 03/29/2017    Allergies  Allergen Reactions   Sulfa Antibiotics Anaphylaxis and Shortness Of Breath    Past Surgical History:  Procedure Laterality Date   TONSILLECTOMY     1990    Social History   Tobacco Use   Smoking status: Never   Smokeless tobacco: Never  Substance Use Topics   Alcohol use: Yes    Alcohol/week: 4.0 standard drinks of alcohol    Types: 4 Glasses of wine per week    Comment: occas   Drug use: No     Medication list has been reviewed and  updated.  Current Meds  Medication Sig   buPROPion  (WELLBUTRIN  XL) 300 MG 24 hr tablet Take 300 mg by mouth daily.   FLUoxetine  (PROZAC ) 40 MG capsule Take 30 mg by mouth daily.   [DISCONTINUED] Cholecalciferol  1.25 MG (50000 UT) capsule Take 1 capsule (50,000 Units total) by mouth once a week. D3   [DISCONTINUED] esomeprazole  (NEXIUM ) 40 MG capsule Take 1 capsule (40 mg total) by mouth daily at 12 noon.   [DISCONTINUED] SUMAtriptan  (IMITREX ) 50 MG tablet TAKE 1 TABLET BY MOUTH AS NEEDED FOR MIGRAINE, MAY REPEAT IN 2 HOURS IF NEEDED (MAX DOSE 4 TABLETS IN 1 DAY, DO NOT TAKE MORE THAN 2 TIMES)       06/21/2023    9:47 AM 12/20/2022    2:22 PM 06/07/2022    2:30 PM  GAD 7 : Generalized Anxiety Score  Nervous, Anxious, on Edge 2 2 2   Control/stop worrying 0 0 0  Worry too much - different things 1 2 3   Trouble relaxing 1 1 0  Restless 0 0 0  Easily annoyed or irritable 3 3 2   Afraid - awful might happen 0 0 0  Total GAD 7 Score 7 8 7   Anxiety Difficulty Somewhat difficult Somewhat difficult Not difficult at all       06/21/2023    9:46 AM 12/20/2022    2:22 PM 06/07/2022    2:29 PM  Depression screen PHQ 2/9  Decreased Interest 2 3 2   Down, Depressed, Hopeless 1 1 0  PHQ - 2 Score 3 4 2   Altered sleeping 0 0 1  Tired, decreased energy 3 3 1   Change in appetite 3 0   Feeling bad or failure about yourself  0 0 0  Trouble concentrating 2 3 2   Moving slowly or fidgety/restless 0 0 0  Suicidal thoughts 0 0 0  PHQ-9 Score 11 10 6   Difficult doing work/chores Somewhat difficult Very difficult Not difficult at all    BP Readings from Last 3 Encounters:  06/21/23 124/82  12/20/22 104/70  06/07/22 104/70    Physical Exam Vitals and nursing note reviewed.  Constitutional:      Appearance: Normal appearance. He is well-developed.  HENT:     Head: Normocephalic.     Right Ear: Tympanic membrane, ear canal and external ear normal.     Left Ear: Tympanic membrane, ear canal and  external ear normal.     Nose: Nose normal.  Eyes:     Conjunctiva/sclera: Conjunctivae normal.     Pupils: Pupils are equal, round, and reactive to light.  Neck:     Thyroid: No thyromegaly.     Vascular: No carotid bruit.  Cardiovascular:     Rate and Rhythm: Normal rate and regular rhythm.     Pulses: Normal pulses.  Heart sounds: Normal heart sounds. No murmur heard. Pulmonary:     Effort: Pulmonary effort is normal.     Breath sounds: Normal breath sounds. No wheezing.  Chest:  Breasts:    Right: No mass.     Left: No mass.  Abdominal:     General: Bowel sounds are normal.     Palpations: Abdomen is soft.     Tenderness: There is no abdominal tenderness.  Musculoskeletal:        General: Normal range of motion.     Cervical back: Normal range of motion and neck supple.     Right lower leg: No edema.     Left lower leg: No edema.  Lymphadenopathy:     Cervical: No cervical adenopathy.  Skin:    General: Skin is warm and dry.  Neurological:     General: No focal deficit present.     Mental Status: He is alert and oriented to person, place, and time.     Deep Tendon Reflexes: Reflexes are normal and symmetric.  Psychiatric:        Attention and Perception: Attention normal.        Mood and Affect: Mood normal.        Thought Content: Thought content normal.     Wt Readings from Last 3 Encounters:  06/21/23 213 lb 2 oz (96.7 kg)  12/20/22 207 lb (93.9 kg)  06/07/22 200 lb (90.7 kg)    BP 124/82   Pulse 92   Ht 5' 10.2" (1.783 m)   Wt 213 lb 2 oz (96.7 kg)   SpO2 97%   BMI 30.41 kg/m   Assessment and Plan:  Problem List Items Addressed This Visit       Unprioritized   Migraine without status migrainosus, not intractable (Chronic)   No recent change in migraine headaches. Headaches respond well to current therapy with Imitrex  prn. Will continue regimen;  follow up if worsening.       Relevant Medications   SUMAtriptan  (IMITREX ) 50 MG tablet    Anxiety (Chronic)   Seeing psych - will discuss recent panic attack      Relevant Orders   TSH   Gastroesophageal reflux disease (Chronic)   Reflux symptoms are controlled on Nexium  daily. Patient denies red flag symptoms - no melena, weight loss, dysphagia.       Relevant Medications   esomeprazole  (NEXIUM ) 40 MG capsule   Other Relevant Orders   CBC with Differential/Platelet   Vitamin D  deficiency (Chronic)   Continues on weekly high dose vitamin D .      Relevant Medications   Cholecalciferol  1.25 MG (50000 UT) capsule   Other Relevant Orders   VITAMIN D  25 Hydroxy (Vit-D Deficiency, Fractures)   Other Visit Diagnoses       Annual physical exam    -  Primary   up to date on immunizations and screenings   Relevant Orders   CBC with Differential/Platelet   Comprehensive metabolic panel with GFR   Lipid panel   TSH   VITAMIN D  25 Hydroxy (Vit-D Deficiency, Fractures)     Mild hyperlipidemia       Relevant Orders   Lipid panel       Return in about 1 year (around 06/20/2024) for CPX  with Dr. Cari Char.    Sheron Dixons, MD [facility] [place]

## 2023-06-21 NOTE — Assessment & Plan Note (Signed)
 No recent change in migraine headaches. Headaches respond well to current therapy with Imitrex  prn. Will continue regimen;  follow up if worsening.

## 2023-06-21 NOTE — Assessment & Plan Note (Signed)
 Seeing psych - will discuss recent panic attack

## 2023-06-21 NOTE — Assessment & Plan Note (Signed)
 Reflux symptoms are controlled on Nexium  daily. Patient denies red flag symptoms - no melena, weight loss, dysphagia.

## 2023-06-22 ENCOUNTER — Ambulatory Visit: Payer: Self-pay | Admitting: Internal Medicine

## 2023-06-22 LAB — CBC WITH DIFFERENTIAL/PLATELET
Basophils Absolute: 0 10*3/uL (ref 0.0–0.2)
Basos: 1 %
EOS (ABSOLUTE): 0.1 10*3/uL (ref 0.0–0.4)
Eos: 2 %
Hematocrit: 43.1 % (ref 37.5–51.0)
Hemoglobin: 14.1 g/dL (ref 13.0–17.7)
Immature Grans (Abs): 0 10*3/uL (ref 0.0–0.1)
Immature Granulocytes: 0 %
Lymphocytes Absolute: 1.8 10*3/uL (ref 0.7–3.1)
Lymphs: 28 %
MCH: 31.6 pg (ref 26.6–33.0)
MCHC: 32.7 g/dL (ref 31.5–35.7)
MCV: 97 fL (ref 79–97)
Monocytes Absolute: 0.5 10*3/uL (ref 0.1–0.9)
Monocytes: 8 %
Neutrophils Absolute: 3.7 10*3/uL (ref 1.4–7.0)
Neutrophils: 60 %
Platelets: 288 10*3/uL (ref 150–450)
RBC: 4.46 x10E6/uL (ref 4.14–5.80)
RDW: 12.5 % (ref 11.6–15.4)
WBC: 6.2 10*3/uL (ref 3.4–10.8)

## 2023-06-22 LAB — COMPREHENSIVE METABOLIC PANEL WITH GFR
ALT: 53 IU/L — ABNORMAL HIGH (ref 0–44)
AST: 36 IU/L (ref 0–40)
Albumin: 4.7 g/dL (ref 4.1–5.1)
Alkaline Phosphatase: 92 IU/L (ref 44–121)
BUN/Creatinine Ratio: 14 (ref 9–20)
BUN: 14 mg/dL (ref 6–20)
Bilirubin Total: 0.4 mg/dL (ref 0.0–1.2)
CO2: 23 mmol/L (ref 20–29)
Calcium: 9.4 mg/dL (ref 8.7–10.2)
Chloride: 101 mmol/L (ref 96–106)
Creatinine, Ser: 1.02 mg/dL (ref 0.76–1.27)
Globulin, Total: 2 g/dL (ref 1.5–4.5)
Glucose: 87 mg/dL (ref 70–99)
Potassium: 4.5 mmol/L (ref 3.5–5.2)
Sodium: 140 mmol/L (ref 134–144)
Total Protein: 6.7 g/dL (ref 6.0–8.5)
eGFR: 96 mL/min/{1.73_m2} (ref >59)

## 2023-06-22 LAB — VITAMIN D 25 HYDROXY (VIT D DEFICIENCY, FRACTURES): Vit D, 25-Hydroxy: 120 ng/mL — ABNORMAL HIGH (ref 30.0–100.0)

## 2023-06-22 LAB — LIPID PANEL
Chol/HDL Ratio: 3.1 ratio (ref 0.0–5.0)
Cholesterol, Total: 207 mg/dL — ABNORMAL HIGH (ref 100–199)
HDL: 67 mg/dL (ref >39)
LDL Chol Calc (NIH): 126 mg/dL — ABNORMAL HIGH (ref 0–99)
Triglycerides: 77 mg/dL (ref 0–149)
VLDL Cholesterol Cal: 14 mg/dL (ref 5–40)

## 2023-06-22 LAB — TSH: TSH: 1.79 u[IU]/mL (ref 0.450–4.500)

## 2023-08-09 ENCOUNTER — Other Ambulatory Visit: Payer: Self-pay

## 2023-08-09 DIAGNOSIS — W272XXA Contact with scissors, initial encounter: Secondary | ICD-10-CM | POA: Insufficient documentation

## 2023-08-09 DIAGNOSIS — Z8616 Personal history of COVID-19: Secondary | ICD-10-CM | POA: Insufficient documentation

## 2023-08-09 DIAGNOSIS — S61217A Laceration without foreign body of left little finger without damage to nail, initial encounter: Secondary | ICD-10-CM | POA: Insufficient documentation

## 2023-08-09 NOTE — ED Triage Notes (Signed)
 Pt reports he cut the tip of his left pinky finger with kitchen scissors, pt has not been able to get bleeding to stop for the past hour.

## 2023-08-10 ENCOUNTER — Emergency Department
Admission: EM | Admit: 2023-08-10 | Discharge: 2023-08-10 | Disposition: A | Discharge status: Home / Self Care | Payer: Self-pay | Attending: Emergency Medicine | Admitting: Emergency Medicine

## 2023-08-10 ENCOUNTER — Ambulatory Visit: Payer: Self-pay

## 2023-08-10 DIAGNOSIS — S61217A Laceration without foreign body of left little finger without damage to nail, initial encounter: Secondary | ICD-10-CM

## 2023-08-10 MED ORDER — BACITRACIN ZINC 500 UNIT/GM EX OINT
TOPICAL_OINTMENT | Freq: Once | CUTANEOUS | Status: AC
  Administered 2023-08-10: 1 via TOPICAL
  Filled 2023-08-10: qty 0.9

## 2023-08-10 MED ORDER — LIDOCAINE HCL (PF) 1 % IJ SOLN
5.0000 mL | Freq: Once | INTRAMUSCULAR | Status: AC
  Administered 2023-08-10: 5 mL via INTRADERMAL
  Filled 2023-08-10: qty 5

## 2023-08-10 NOTE — ED Notes (Signed)
 Pt presented to ED with c/o laceration to left pinky after cutting tag off of something with scissors. Bleeding controlled.

## 2023-08-10 NOTE — Discharge Instructions (Signed)
 You may alternate over the counter Tylenol 1000 mg every 6 hours as needed for pain, fever and Ibuprofen 800 mg every 6-8 hours as needed for pain, fever.  Please take Ibuprofen with food.  Do not take more than 4000 mg of Tylenol (acetaminophen) in a 24 hour period.  You may clean your wound gently daily with soap and warm water and then pat dry.  Your sutures are absorbable and will come out on their own in the next 2 to 3 weeks.  If they have not come out by the end of 3 weeks, you may return to the emergency department, urgent care or your PCP for removal.  Please do not apply over-the-counter antibiotic ointment as this can breakdown absorbable sutures too quickly.  Also many people have reactions to these ointments and can delay wound healing rather than help it.  Please keep your wound clean and dry.  You may leave it uncovered or cover it with a dressing daily.  All wounds have the risk of becoming infected.  We do everything sterilely to try to prevent this and clean the wound with saline, Betadine or chlorhexidine.  If your wound becomes red, warm, draining pus, has a foul odor or you have a fever of 100.4 or higher, please return to the emergency department or urgent care.  All wounds also have the risk of scarring.  I recommend once your wound has completely healed and your sutures have come out or have been removed that you make sure to keep sunscreen on your scars every day for at least the next year.  You may also use over-the-counter Mederma once the wound is healed which can help with scar formation.

## 2023-08-10 NOTE — ED Provider Notes (Signed)
 [facility] Provider Note    Event Date/Time   First MD Initiated Contact with Patient 08/10/23 0024     (approximate)   History   Laceration   HPI  Kevin Bowers is a 38 y.o. male right-hand-dominant male with history of migraines, anxiety who presents to the emergency department after he cut his left little finger accidentally with a pair of kitchen scissors.  He states the scissors were clean.  He reports his last tetanus vaccine was a year ago.  He denies any other injury.   History provided by patient.    Past Medical History:  Diagnosis Date   Anxiety    COVID-19    06/2020   GERD (gastroesophageal reflux disease)    Hyperhidrosis    Migraine    On pre-exposure prophylaxis for HIV 06/07/2022   Medications prescribed by on-line provider  Discontinued medications in 2024 due to no current risk factors      Past Surgical History:  Procedure Laterality Date   TONSILLECTOMY     1990    MEDICATIONS:  Prior to Admission medications   Medication Sig Start Date End Date Taking? Authorizing Provider  buPROPion  (WELLBUTRIN  XL) 300 MG 24 hr tablet Take 300 mg by mouth daily.    [provider]  Cholecalciferol  1.25 MG (50000 UT) capsule Take 1 capsule (50,000 Units total) by mouth once a week. D3 06/21/23   Justus Leita DEL, MD  esomeprazole  (NEXIUM ) 40 MG capsule Take 1 capsule (40 mg total) by mouth daily at 12 noon. 06/21/23   Justus Leita DEL, MD  FLUoxetine  (PROZAC ) 40 MG capsule Take 30 mg by mouth daily.    [provider]  SUMAtriptan  (IMITREX ) 50 MG tablet TAKE 1 TABLET BY MOUTH AS NEEDED FOR MIGRAINE, MAY REPEAT IN 2 HOURS IF NEEDED (MAX DOSE 4 TABLETS IN 1 DAY, DO NOT TAKE MORE THAN 2 TIMES) 06/21/23   Justus Leita DEL, MD    Physical Exam   Triage Vital Signs: ED Triage Vitals  Encounter Vitals Group     BP 08/09/23 2243 (!) 128/95     Girls Systolic BP Percentile --      Girls Diastolic BP Percentile --       Boys Systolic BP Percentile --      Boys Diastolic BP Percentile --      Pulse Rate 08/09/23 2243 93     Resp 08/09/23 2243 18     Temp 08/09/23 2243 98.6 F (37 C)     Temp src --      SpO2 08/09/23 2243 100 %     Weight 08/09/23 2242 205 lb (93 kg)     Height 08/09/23 2242 5' 10 (1.778 m)     Head Circumference --      Peak Flow --      Pain Score 08/09/23 2242 2     Pain Loc --      Pain Education --      Exclude from Growth Chart --     Most recent vital signs: Vitals:   08/09/23 2243 08/10/23 0151  BP: (!) 128/95 (!) 123/95  Pulse: 93 90  Resp: 18 16  Temp: 98.6 F (37 C)   SpO2: 100% 100%     CONSTITUTIONAL: Alert and responds appropriately to questions. Well-appearing; well-nourished, extremely pleasant HEAD: Normocephalic, atraumatic EYES: Conjunctivae clear, pupils appear equal ENT: normal nose; moist mucous membranes NECK: Normal range of motion CARD: Regular rate and rhythm  RESP: Normal chest excursion without splinting or tachypnea; no hypoxia or respiratory distress, speaking full sentences ABD/GI: non-distended EXT: Normal ROM in all joints, no major deformities noted, 3 cm laceration to the tip of the left little finger that does not involve tendon, bone, the nail.  2+ left radial pulse.  No bony deformity, joint effusion. SKIN: Normal color for age and race, no rashes on exposed skin NEURO: Moves all extremities equally, normal speech, no facial asymmetry noted PSYCH: The patient's mood and manner are appropriate. Grooming and personal hygiene are appropriate.  ED Results / Procedures / Treatments   LABS: (all labs ordered are listed, but only abnormal results are displayed) Labs Reviewed - No data to display   EKG:    RADIOLOGY: My personal review and interpretation of imaging:    I have personally reviewed all radiology reports. No results found.   PROCEDURES:  Critical Care performed: No   LACERATION REPAIR Performed by: Josette Sink Authorized by: Josette Sink Consent: Verbal consent obtained. Risks and benefits: risks, benefits and alternatives were discussed Consent given by: patient Patient identity confirmed: provided demographic data Prepped and Draped in normal sterile fashion Wound explored  Laceration Location: Left little finger  Laceration Length: 3cm  No Foreign Bodies seen or palpated  Anesthesia: local infiltration  Local anesthetic: lidocaine  1% without epinephrine  Anesthetic total: 3 ml  Irrigation method: syringe Amount of cleaning: standard  Skin closure: Superficial  Number of sutures: 10  Technique: Area anesthetized using lidocaine  1% without epinephrine. Wound irrigated copiously with sterile saline. Wound then cleaned with Betadine and draped in sterile fashion. Wound closed using 10 simple interrupted sutures with 6-0 Vicryl.  Bacitracin  and sterile dressing applied. Good wound approximation and hemostasis achieved.  Then covered with an aluminum splint for protection.   Patient tolerance: Patient tolerated the procedure well with no immediate complications.    Procedures    IMPRESSION / MDM / ASSESSMENT AND PLAN / ED COURSE  I reviewed the triage vital signs and the nursing notes.   Patient here with a laceration to the left little finger.     DIFFERENTIAL DIAGNOSIS (includes but not limited to):   Laceration, no tendon injury, doubt fracture, no sign of retained foreign body, no sign of superimposed infection  Patient's presentation is most consistent with acute, uncomplicated illness.  PLAN: Will clean, anesthetize, repair wound.  Tetanus vaccine is up-to-date.   MEDICATIONS GIVEN IN ED: Medications  lidocaine  (PF) (XYLOCAINE ) 1 % injection 5 mL (5 mLs Intradermal Given 08/10/23 0037)  bacitracin  ointment (1 Application Topical Given 08/10/23 0037)     ED COURSE: Patient tolerated repair with sutures without difficulty.  Discussed wound care  instructions, return precautions.  Will discharge in an aluminum splint for protection.  Patient verbalized understanding.   At this time, I do not feel there is any life-threatening condition present. I reviewed all nursing notes, vitals, pertinent previous records.  All lab and urine results, EKGs, imaging ordered have been independently reviewed and interpreted by myself.  I reviewed all available radiology reports from any imaging ordered this visit.  Based on my assessment, I feel the patient is safe to be discharged home without further emergent workup and can continue workup as an outpatient as needed. Discussed all findings, treatment plan as well as usual and customary return precautions.  They verbalize understanding and are comfortable with this plan.  Outpatient follow-up has been provided as needed.  All questions have been answered.  CONSULTS:  none   OUTSIDE RECORDS REVIEWED: Reviewed last PCP visit on 06/21/2023.     FINAL CLINICAL IMPRESSION(S) / ED DIAGNOSES   Final diagnoses:  Laceration of left little finger without foreign body without damage to nail, initial encounter     Rx / DC Orders   ED Discharge Orders     None        Note:  This document was prepared using Dragon voice recognition software and may include unintentional dictation errors.   Abbeygail Igoe, Josette SAILOR, DO 08/10/23 0211

## 2023-08-10 NOTE — ED Notes (Signed)
Ward MD at bedside. 

## 2023-08-10 NOTE — ED Notes (Signed)
 Left pinky covered with non adhesive dressing and finger splint. Pt tolerated well. Pt educated on discharge instructions and verbalized understanding. NADN. All questions answered.

## 2023-12-04 ENCOUNTER — Other Ambulatory Visit: Payer: Self-pay | Admitting: Internal Medicine

## 2023-12-04 DIAGNOSIS — K219 Gastro-esophageal reflux disease without esophagitis: Secondary | ICD-10-CM

## 2023-12-06 NOTE — Telephone Encounter (Signed)
 Requested Prescriptions  Pending Prescriptions Disp Refills   esomeprazole  (NEXIUM ) 40 MG capsule [Pharmacy Med Name: ESOMEPRAZOLE  MAG DR 40 MG CAP] 90 capsule 2    Sig: TAKE ONE CAPSULE BY MOUTH ONE TIME DAILY AT NOON     Gastroenterology: Proton Pump Inhibitors 2 Failed - 12/06/2023  3:42 PM      Failed - ALT in normal range and within 360 days    ALT  Date Value Ref Range Status  06/21/2023 53 (H) 0 - 44 IU/L Final         Passed - AST in normal range and within 360 days    AST  Date Value Ref Range Status  06/21/2023 36 0 - 40 IU/L Final         Passed - Valid encounter within last 12 months    Recent Outpatient Visits           5 months ago Annual physical exam   [facility] at [facility], Leita DEL, MD       Future Appointments             In 6 months Lemon Raisin, MD [facility] at [facility], [phone] [place]

## 2024-06-24 ENCOUNTER — Encounter: Payer: Self-pay | Admitting: Student
